# Patient Record
Sex: Male | Born: 1960 | Race: Black or African American | Hispanic: No | Marital: Married | Smoking: Current every day smoker
Health system: Southern US, Community
[De-identification: ages and names within clinical notes are randomized; demographics above are authoritative.]

## PROBLEM LIST (undated history)

## (undated) DIAGNOSIS — I10 Essential (primary) hypertension: Secondary | ICD-10-CM

---

## 2014-01-03 ENCOUNTER — Ambulatory Visit: Payer: Self-pay | Admitting: Family Medicine

## 2014-12-30 ENCOUNTER — Ambulatory Visit
Admission: RE | Admit: 2014-12-30 | Discharge: 2014-12-30 | Disposition: A | Discharge status: Home / Self Care | Payer: BLUE CROSS/BLUE SHIELD | Source: Ambulatory Visit | Attending: Family Medicine | Admitting: Family Medicine

## 2014-12-30 ENCOUNTER — Other Ambulatory Visit: Payer: Self-pay | Admitting: Family Medicine

## 2014-12-30 DIAGNOSIS — M25572 Pain in left ankle and joints of left foot: Secondary | ICD-10-CM

## 2014-12-30 DIAGNOSIS — M79672 Pain in left foot: Secondary | ICD-10-CM

## 2015-01-31 ENCOUNTER — Ambulatory Visit: Payer: Self-pay | Admitting: Family Medicine

## 2017-05-29 ENCOUNTER — Emergency Department (HOSPITAL_COMMUNITY)
Admission: EM | Admit: 2017-05-29 | Discharge: 2017-05-29 | Disposition: A | Discharge status: Home / Self Care | Payer: BLUE CROSS/BLUE SHIELD | Attending: Emergency Medicine | Admitting: Emergency Medicine

## 2017-05-29 ENCOUNTER — Encounter (HOSPITAL_COMMUNITY): Payer: Self-pay | Admitting: *Deleted

## 2017-05-29 ENCOUNTER — Emergency Department (HOSPITAL_COMMUNITY): Payer: BLUE CROSS/BLUE SHIELD

## 2017-05-29 DIAGNOSIS — Z8709 Personal history of other diseases of the respiratory system: Secondary | ICD-10-CM | POA: Diagnosis not present

## 2017-05-29 DIAGNOSIS — J029 Acute pharyngitis, unspecified: Secondary | ICD-10-CM | POA: Insufficient documentation

## 2017-05-29 DIAGNOSIS — F17210 Nicotine dependence, cigarettes, uncomplicated: Secondary | ICD-10-CM | POA: Insufficient documentation

## 2017-05-29 DIAGNOSIS — I10 Essential (primary) hypertension: Secondary | ICD-10-CM | POA: Insufficient documentation

## 2017-05-29 DIAGNOSIS — Z9119 Patient's noncompliance with other medical treatment and regimen: Secondary | ICD-10-CM | POA: Insufficient documentation

## 2017-05-29 HISTORY — DX: Essential (primary) hypertension: I10

## 2017-05-29 LAB — RAPID STREP SCREEN (MED CTR MEBANE ONLY): Streptococcus, Group A Screen (Direct): NEGATIVE

## 2017-05-29 MED ORDER — HYDROCODONE-ACETAMINOPHEN 5-325 MG PO TABS
1.0000 | ORAL_TABLET | Freq: Once | ORAL | Status: AC
  Administered 2017-05-29: 1 via ORAL
  Filled 2017-05-29: qty 1

## 2017-05-29 MED ORDER — HYDROCODONE-ACETAMINOPHEN 5-325 MG PO TABS: 1.0000 | ORAL_TABLET | ORAL | 0 refills | Status: DC | PRN

## 2017-05-29 MED ORDER — CLINDAMYCIN HCL 150 MG PO CAPS
300.0000 mg | ORAL_CAPSULE | Freq: Once | ORAL | Status: AC
  Administered 2017-05-29: 300 mg via ORAL
  Filled 2017-05-29: qty 2

## 2017-05-29 MED ORDER — LOSARTAN POTASSIUM-HCTZ 100-25 MG PO TABS: 1.0000 | ORAL_TABLET | Freq: Every day | ORAL | 0 refills | Status: DC

## 2017-05-29 MED ORDER — CLINDAMYCIN HCL 300 MG PO CAPS: 300.0000 mg | ORAL_CAPSULE | Freq: Four times a day (QID) | ORAL | 0 refills | Status: DC

## 2017-05-29 NOTE — ED Triage Notes (Signed)
Pt has right sided throat swelling starting on Tuesday. Pt right tonsil is touching uvula. Pt not having in obvious breathing difficulties. Pt has had hx of the same with PTA dx and was admitted.

## 2017-05-29 NOTE — ED Provider Notes (Signed)
[facility]-EMERGENCY DEPT Provider Note   CSN: [id number] Arrival date & time: 05/29/17  1743     History   Chief Complaint Chief Complaint  Patient presents with  . Oral Swelling    HPI Nicholas Mcknight is a 56 y.o. male.  HPI Complains of sore throat with pain on swallowing onset 2 days ago, improved today. Pain predominantly on right side of throat. No voice change no difficulty breathing. He reports that earlier today his pain was much worse however it is improved since he drank water. No other treatment prior to coming here. Pain is described as mild at present. He also reports fever earlier today with temperature 101. Seen at urgent care center prior to coming here sent here for further evaluation No shortness of breath no other associated symptoms Past Medical History:  Diagnosis Date  . Hypertension   Peritonsillar abscess 20 years ago, hospitalized in [place]  There are no active problems to display for this patient.   History reviewed. No pertinent surgical history.     Home Medications    Prior to Admission medications   Not on File    Family History No family history on file.  Social History Social History  Substance Use Topics  . Smoking status: Current Every Day Smoker    Packs/day: 0.25    Types: Cigarettes  . Smokeless tobacco: Never Used  . Alcohol use Yes     Comment: occ.   Positive marijuana use no IV drug use  Allergies   Patient has no known allergies.   Review of Systems Review of Systems  Constitutional: Positive for fever.  HENT: Positive for sore throat.   Respiratory: Negative.   Cardiovascular: Negative.   Gastrointestinal: Negative.   Musculoskeletal: Negative.   Skin: Negative.   Neurological: Negative.   Psychiatric/Behavioral: Negative.   All other systems reviewed and are negative.    Physical Exam Updated Vital Signs BP (!) 175/116 (BP Location: Right Arm) Comment: Pt was taking BP medication, but has quit taking it  the past 2 years.   Pulse 95   Temp 98.3 F (36.8 C) (Oral)   Resp 17   Ht  (1.803 m)   Wt 89.8 kg (198 lb)   SpO2 97%   BMI 27.62 kg/m   Physical Exam  Constitutional: He is oriented to person, place, and time. He appears well-developed and well-nourished. No distress.  HENT:  Head: Normocephalic and atraumatic.  Handling secretions well. No hot potato voice. Oropharynx reddened. Bilateral tonsils red and enlarged with right greater than left. Uvula midline  Eyes: Pupils are equal, round, and reactive to light. Conjunctivae are normal.  Neck: Neck supple. No tracheal deviation present. No thyromegaly present.  Cardiovascular: Normal rate and regular rhythm.   No murmur heard. Pulmonary/Chest: Effort normal and breath sounds normal.  Abdominal: Soft. Bowel sounds are normal. He exhibits no distension. There is no tenderness.  Musculoskeletal: Normal range of motion. He exhibits no edema or tenderness.  Lymphadenopathy:    He has no cervical adenopathy.  Neurological: He is alert and oriented to person, place, and time. Coordination normal.  Skin: Skin is warm and dry. No rash noted.  Psychiatric: He has a normal mood and affect.  Nursing note and vitals reviewed.    ED Treatments / Results  Labs (all labs ordered are listed, but only abnormal results are displayed) Labs Reviewed  RAPID STREP SCREEN (NOT AT [facility])  CULTURE, GROUP A STREP [facility])   Results for  orders placed or performed during the hospital encounter of 05/29/17  Rapid strep screen  Result Value Ref Range   Streptococcus, Group A Screen (Direct) NEGATIVE NEGATIVE   Ct Soft Tissue Neck Wo Contrast  Result Date: 05/29/2017 CLINICAL DATA:  Initial evaluation for acute sore throat, right-sided neck swelling for 2 days. EXAM: CT NECK WITHOUT CONTRAST TECHNIQUE: Multidetector CT imaging of the neck was performed following the standard protocol without intravenous contrast. COMPARISON:  None available.  FINDINGS: Pharynx and larynx: Oral cavity within normal limits. Well-circumscribed curvilinear cystic lesion at the right floor of mouth/sublingual space without associated inflammatory changes, favored to reflect a ranula (series 2, image 65). Palatine tonsils are enlarged bilaterally, right greater than left, suggesting acute tonsillitis. Induration of the right parapharyngeal fat. There is swelling with mucosal edema within the adjacent right oropharynx, extending inferiorly within the right pharynx. Trace layering retropharyngeal effusion noted, likely reactive. No frank retropharyngeal collection. No definite tonsillar or peritonsillar collection on this noncontrast examination. Epiglottis itself within normal limits. Lingual tonsils largely efface the vallecula. Remainder of the hypopharynx and supraglottic larynx within normal limits. Motion artifact limits evaluation of the true cords. Subglottic airway clear. Salivary glands: Salivary glands including the parotid and submandibular glands are within normal limits. Thyroid: Thyroid within normal limits. Lymph nodes: No pathologically enlarged lymph nodes identified within the neck. Vascular: Mild scattered plaque about the aortic arch and left carotid bifurcation. Limited intracranial: Intracranial atherosclerosis. Otherwise unremarkable. Visualized orbits: Globes and oval soft tissues within normal limits. Mastoids and visualized paranasal sinuses: Mucous retention cyst present within the inferior right maxillary sinus. Paranasal sinuses otherwise clear. No mastoid effusion. Middle ear cavities are clear. Skeleton: No acute osseous abnormality. No worrisome lytic or blastic osseous lesions. Upper chest: Visualized upper chest within normal limits. Partially visualized lungs are clear. Other: None. IMPRESSION: 1. Asymmetric enlargement of the right palatine tonsil with associated mucosal edema/swelling within the adjacent right pharynx. Findings consistent  with acute tonsillitis/pharyngitis. No appreciable abscess identified on this noncontrast examination. 2. 3.1 x 1.1 x 1.9 cm ranula at the right floor of mouth/sublingual space. Electronically Signed   By: Rise Mu M.D.   On: 05/29/2017 19:36   EKG  EKG Interpretation None       Radiology No results found.  Procedures Procedures (including critical care time)  Medications Ordered in ED Medications - No data to display   Initial Impression / Assessment and Plan / ED Course  I have reviewed the triage vital signs and the nursing notes.  Pertinent labs & imaging results that were available during my care of the patient were reviewed by me and considered in my medical decision making (see chart for details).     Patient hasn't been noncompliant with his blood pressure medicine, Hyzaar for several months. He states he has 20 tablets left in his current prescription and does not currently have a PCP. I've written prescription for Hyzaar 30 day supply and referred him to primary care. I counseled him for 5 minutes on smoking cessation. I've also referred him to Dr. Jenne Pane, ENT specialist if not improving in a week Prescriptions Cleocin, Hyzaar, Norco.  [facility] Controlled Substance reporting System queried Final Clinical Impressions(s) / ED Diagnoses  Diagnoses #1 acute pharyngitis #2 hypertension #3 tobacco abuse #4 medication noncompliance Final diagnoses:  None    New Prescriptions New Prescriptions   No medications on file     Doug Sou, MD 05/29/17 2015

## 2017-05-29 NOTE — Discharge Instructions (Signed)
Take the pain medication as prescribed for bad pain or take Tylenol for mild pain. Don't take the pain medicine prescribed together with Tylenol as accommodation be dangerous to your liver. Call Dr. Jenne Pane to schedule an office visit if your throat is not feeling better by next week. Return if your condition worsens for any reason. Call the number on this chart to get a primary care physician and arrange to get your blood pressure rechecked within a week at your new primary care physician's office or at an urgent care center. Make sure that you take your blood pressure medication. Ask your new primary care physician to help you to stop smoking

## 2017-06-01 LAB — CULTURE, GROUP A STREP (THRC)

## 2019-03-25 ENCOUNTER — Emergency Department (HOSPITAL_COMMUNITY)
Admission: EM | Admit: 2019-03-25 | Discharge: 2019-03-25 | Disposition: A | Discharge status: Home / Self Care | Payer: BC Managed Care – PPO | Attending: Emergency Medicine | Admitting: Emergency Medicine

## 2019-03-25 ENCOUNTER — Emergency Department (HOSPITAL_COMMUNITY): Payer: BC Managed Care – PPO

## 2019-03-25 ENCOUNTER — Encounter (HOSPITAL_COMMUNITY): Payer: Self-pay | Admitting: Emergency Medicine

## 2019-03-25 ENCOUNTER — Other Ambulatory Visit: Payer: Self-pay

## 2019-03-25 DIAGNOSIS — I10 Essential (primary) hypertension: Secondary | ICD-10-CM | POA: Diagnosis present

## 2019-03-25 DIAGNOSIS — Z79899 Other long term (current) drug therapy: Secondary | ICD-10-CM | POA: Insufficient documentation

## 2019-03-25 DIAGNOSIS — F17210 Nicotine dependence, cigarettes, uncomplicated: Secondary | ICD-10-CM | POA: Insufficient documentation

## 2019-03-25 DIAGNOSIS — R519 Headache, unspecified: Secondary | ICD-10-CM

## 2019-03-25 DIAGNOSIS — R51 Headache: Secondary | ICD-10-CM | POA: Insufficient documentation

## 2019-03-25 LAB — CBC
HCT: 52.1 % — ABNORMAL HIGH (ref 39.0–52.0)
Hemoglobin: 16.5 g/dL (ref 13.0–17.0)
MCH: 30 pg (ref 26.0–34.0)
MCHC: 31.7 g/dL (ref 30.0–36.0)
MCV: 94.7 fL (ref 80.0–100.0)
Platelets: 262 10*3/uL (ref 150–400)
RBC: 5.5 MIL/uL (ref 4.22–5.81)
RDW: 13.7 % (ref 11.5–15.5)
WBC: 8.3 10*3/uL (ref 4.0–10.5)
nRBC: 0 % (ref 0.0–0.2)

## 2019-03-25 LAB — BASIC METABOLIC PANEL
Anion gap: 8 (ref 5–15)
BUN: 19 mg/dL (ref 6–20)
CO2: 27 mmol/L (ref 22–32)
Calcium: 9 mg/dL (ref 8.9–10.3)
Chloride: 105 mmol/L (ref 98–111)
Creatinine, Ser: 1.11 mg/dL (ref 0.61–1.24)
GFR calc Af Amer: >60 mL/min (ref >60)
GFR calc non Af Amer: >60 mL/min (ref >60)
Glucose, Bld: 111 mg/dL — ABNORMAL HIGH (ref 70–99)
Potassium: 3.6 mmol/L (ref 3.5–5.1)
Sodium: 140 mmol/L (ref 135–145)

## 2019-03-25 MED ORDER — ACETAMINOPHEN 500 MG PO TABS
1000.0000 mg | ORAL_TABLET | Freq: Once | ORAL | Status: AC
  Administered 2019-03-25: 1000 mg via ORAL
  Filled 2019-03-25: qty 2

## 2019-03-25 MED ORDER — LOSARTAN POTASSIUM-HCTZ 100-25 MG PO TABS: 1.0000 | ORAL_TABLET | Freq: Once | ORAL | Status: DC

## 2019-03-25 MED ORDER — LOSARTAN POTASSIUM 25 MG PO TABS
100.0000 mg | ORAL_TABLET | Freq: Every day | ORAL | Status: DC
  Administered 2019-03-25: 100 mg via ORAL
  Filled 2019-03-25: qty 4

## 2019-03-25 MED ORDER — HYDROCHLOROTHIAZIDE 25 MG PO TABS
25.0000 mg | ORAL_TABLET | Freq: Every day | ORAL | Status: DC
Start: 2019-03-25 — End: 2019-03-25
  Administered 2019-03-25: 10:00:00 25 mg via ORAL
  Filled 2019-03-25: qty 1

## 2019-03-25 NOTE — Discharge Instructions (Addendum)
It was our pleasure to provide your ER care today - we hope that you feel better.  Your blood pressure is high - continue your blood pressure medication, limit salt intake, and follow up with primary care doctor in the coming week for recheck - call your doctor today to discuss whether they want to adjust your blood pressure medication and/or doses.   Take an enteric coated aspirin a day.  Return to ER if worse, new symptoms, fevers, new or severe pain, change in speech or vision, numbness/weakness, chest pain, trouble breathing, or other concern.

## 2019-03-25 NOTE — ED Triage Notes (Signed)
Pt reports home BP 230/133. Pt states he just started his BP medication back last week. Pt endorses headache and vision changes.

## 2019-03-25 NOTE — ED Provider Notes (Signed)
[facility] Provider Note   CSN: [id number] Arrival date & time: 03/25/19  0802     History   Chief Complaint Chief Complaint  Patient presents with  . Hypertension    HPI Lamar BlinksJames C Dellis is a 58 y.o. male.     Patient with hx htn, presents with concern for high blood pressure. States he started back on his bp meds 1 week ago but that bp has stayed high. In past 1-2 days, intermittent frontal headache, dull, non radiating.  States earlier in week briefly saw spots, no current visual changes, no amaurosis or visual field deficit, no eye pain. Denies change in speech. No numbness, weakness, problems w balance or coordination, or loss of normal functional ability. Denies chest pain or discomfort. No unusual doe or sob. No leg swelling.   The history is provided by the patient.  Hypertension Associated symptoms include headaches. Pertinent negatives include no chest pain, no abdominal pain and no shortness of breath.    Past Medical History:  Diagnosis Date  . Hypertension     There are no active problems to display for this patient.   History reviewed. No pertinent surgical history.      Home Medications    Prior to Admission medications   Medication Sig Start Date End Date Taking? Authorizing Provider  clindamycin (CLEOCIN) 300 MG capsule Take 1 capsule (300 mg total) by mouth 4 (four) times daily. X 7 days 05/29/17   Doug SouJacubowitz, Sam, MD  HYDROcodone-acetaminophen (NORCO) 5-325 MG tablet Take 1 tablet by mouth every 4 (four) hours as needed. 05/29/17   Doug SouJacubowitz, Sam, MD  losartan-hydrochlorothiazide (HYZAAR) 100-25 MG tablet Take 1 tablet by mouth daily. 05/29/17   Doug SouJacubowitz, Sam, MD    Family History No family history on file.  Social History Social History   Tobacco Use  . Smoking status: Current Every Day Smoker    Packs/day: 0.25    Types: Cigarettes  . Smokeless tobacco: Never Used  Substance Use Topics  . Alcohol use: Yes   Comment: occ.  . Drug use: No     Allergies   Patient has no known allergies.   Review of Systems Review of Systems  Constitutional: Negative for fever.  HENT: Negative for sore throat.   Eyes: Negative for pain.  Respiratory: Negative for cough and shortness of breath.   Cardiovascular: Negative for chest pain.  Gastrointestinal: Negative for abdominal pain, nausea and vomiting.  Genitourinary: Negative for flank pain.  Musculoskeletal: Negative for back pain and neck pain.  Skin: Negative for rash.  Neurological: Positive for headaches. Negative for speech difficulty, weakness and numbness.  Hematological: Does not bruise/bleed easily.  Psychiatric/Behavioral: Negative for confusion.     Physical Exam Updated Vital Signs BP (!) 178/115 (BP Location: Right Arm)   Pulse 96   Temp 98.4 F (36.9 C) (Oral)   Resp 16   Ht 1.778 m (5\' 10" )   Wt 97.1 kg   SpO2 98%   BMI 30.71 kg/m   Physical Exam Vitals signs and nursing note reviewed.  Constitutional:      Appearance: Normal appearance. He is well-developed.  HENT:     Head: Atraumatic.     Comments: No sinus or temporal tenderness.     Nose: Nose normal.     Mouth/Throat:     Mouth: Mucous membranes are moist.     Pharynx: Oropharynx is clear.  Eyes:     General: No scleral icterus.    Extraocular  Movements: Extraocular movements intact.     Conjunctiva/sclera: Conjunctivae normal.     Pupils: Pupils are equal, round, and reactive to light.  Neck:     Musculoskeletal: Normal range of motion and neck supple. No neck rigidity.     Vascular: No carotid bruit.     Trachea: No tracheal deviation.  Cardiovascular:     Rate and Rhythm: Normal rate and regular rhythm.     Pulses: Normal pulses.     Heart sounds: Normal heart sounds. No murmur. No friction rub. No gallop.   Pulmonary:     Effort: Pulmonary effort is normal. No accessory muscle usage or respiratory distress.     Breath sounds: Normal breath sounds.   Abdominal:     General: Bowel sounds are normal. There is no distension.     Palpations: Abdomen is soft.     Tenderness: There is no abdominal tenderness. There is no guarding.  Genitourinary:    Comments: No cva tenderness. Musculoskeletal:        General: No swelling or tenderness.     Right lower leg: No edema.     Left lower leg: No edema.  Skin:    General: Skin is warm and dry.     Findings: No rash.  Neurological:     Mental Status: He is alert.     Comments: Alert, speech clear/fluent. No facial weakness or droop. Motor intact bil, stre 5/5. sens grossly intact bil. Steady gait.   Psychiatric:        Mood and Affect: Mood normal.      ED Treatments / Results  Labs (all labs ordered are listed, but only abnormal results are displayed) Results for orders placed or performed during the hospital encounter of 03/25/19  Basic metabolic panel  Result Value Ref Range   Sodium 140 135 - 145 mmol/L   Potassium 3.6 3.5 - 5.1 mmol/L   Chloride 105 98 - 111 mmol/L   CO2 27 22 - 32 mmol/L   Glucose, Bld 111 (H) 70 - 99 mg/dL   BUN 19 6 - 20 mg/dL   Creatinine, Ser 1.611.11 0.61 - 1.24 mg/dL   Calcium 9.0 8.9 - 09.610.3 mg/dL   GFR calc non Af Amer >60 >60 mL/min   GFR calc Af Amer >60 >60 mL/min   Anion gap 8 5 - 15  CBC  Result Value Ref Range   WBC 8.3 4.0 - 10.5 K/uL   RBC 5.50 4.22 - 5.81 MIL/uL   Hemoglobin 16.5 13.0 - 17.0 g/dL   HCT 04.552.1 (H) 40.939.0 - 81.152.0 %   MCV 94.7 80.0 - 100.0 fL   MCH 30.0 26.0 - 34.0 pg   MCHC 31.7 30.0 - 36.0 g/dL   RDW 91.413.7 78.211.5 - 95.615.5 %   Platelets 262 150 - 400 K/uL   nRBC 0.0 0.0 - 0.2 %   Ct Head Wo Contrast  Result Date: 03/25/2019 CLINICAL DATA:  Acute headache with dizziness. EXAM: CT HEAD WITHOUT CONTRAST TECHNIQUE: Contiguous axial images were obtained from the base of the skull through the vertex without intravenous contrast. COMPARISON:  None. FINDINGS: Brain: Ventricles are within normal limits in size and configuration. 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 Minimal chronic small vessel ischemic changes within the bilateral deep periventricular white matter regions. No mass, hemorrhage, edema or other evidence of acute parenchymal abnormality.  No extra-axial hemorrhage. Vascular: Chronic calcified atherosclerotic changes of the large vessels at the skull base. No unexpected hyperdense vessel. Skull: Normal. Negative for fracture or focal lesion. Sinuses/Orbits: No acute finding. Other: None. IMPRESSION: 1. 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 2. Minimal chronic small vessel ischemic changes within the white matter. 3. No evidence of acute intracranial abnormality. No intracranial mass, hemorrhage or edema. Electronically Signed   By: Franki Cabot M.D.   On: 03/25/2019 10:14    EKG EKG Interpretation  Date/Time:  Thursday March 25 2019 08:17:01 EDT Ventricular Rate:  94 PR Interval:    QRS Duration: 116 QT Interval:  377 QTC Calculation: 472 R Axis:   38 Text Interpretation:  Sinus rhythm Nonspecific T wave abnormality No previous tracing Confirmed by Lajean Saver [phone]) on 03/25/2019 8:26:48 AM   Radiology Ct Head Wo Contrast  Result Date: 03/25/2019 CLINICAL DATA:  Acute headache with dizziness. EXAM: CT HEAD WITHOUT CONTRAST TECHNIQUE: Contiguous axial images were obtained from the base of the skull through the vertex without intravenous contrast. COMPARISON:  None. FINDINGS: Brain: Ventricles are within normal limits in size and configuration. 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 Minimal chronic small  vessel ischemic changes within the bilateral deep periventricular white matter regions. No mass, hemorrhage, edema or other evidence of acute parenchymal abnormality. No extra-axial hemorrhage. Vascular: Chronic calcified atherosclerotic changes of the large vessels at the skull base. No unexpected hyperdense vessel. Skull: Normal. Negative for fracture or focal lesion. Sinuses/Orbits: No acute finding. Other: None. IMPRESSION: 1. 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 2. Minimal chronic small vessel ischemic changes within the white matter. 3. No evidence of acute intracranial abnormality. No intracranial mass, hemorrhage or edema. Electronically Signed   By: Franki Cabot M.D.   On: 03/25/2019 10:14    Procedures Procedures (including critical care time)  Medications Ordered in ED Medications  acetaminophen (TYLENOL) tablet 1,000 mg (has no administration in time range)  losartan (COZAAR) tablet 100 mg (has no administration in time range)  hydrochlorothiazide (HYDRODIURIL) tablet 25 mg (has no administration in time range)     Initial Impression / Assessment and Plan / ED Course  I have reviewed the triage vital signs and the nursing notes.  Pertinent labs & imaging results that were available during my care of the patient were reviewed by me and considered in my medical decision making (see chart for details).  Labs and imaging ordered.   Reviewed nursing notes and prior charts for additional history.   Pt has not yet had his bp meds today - dose given in ED.   Acetaminophen po.  Labs reviewed by me - chem normal, cr normal.  CT reviewed by me - no acute hem. Note made of chronic changes. Discussed w pt. rec ecasa.  bp is improved from prior. Patient comfortable, no severe head pain. No nv.   Rec close pcp f/u.  Return precautions provided.    Final Clinical Impressions(s) / ED Diagnoses   Final diagnoses:  None    ED Discharge Orders    None       Lajean Saver, MD 03/25/19  1019  

## 2019-04-19 ENCOUNTER — Encounter: Payer: Self-pay | Admitting: Cardiovascular Disease

## 2019-04-19 ENCOUNTER — Other Ambulatory Visit: Payer: Self-pay

## 2019-04-19 ENCOUNTER — Ambulatory Visit (INDEPENDENT_AMBULATORY_CARE_PROVIDER_SITE_OTHER): Payer: BC Managed Care – PPO | Admitting: Cardiovascular Disease

## 2019-04-19 VITALS — BP 162/96 | HR 75 | Temp 97.8°F | Ht 71.0 in | Wt 226.2 lb

## 2019-04-19 DIAGNOSIS — Z8673 Personal history of transient ischemic attack (TIA), and cerebral infarction without residual deficits: Secondary | ICD-10-CM

## 2019-04-19 DIAGNOSIS — Z9289 Personal history of other medical treatment: Secondary | ICD-10-CM

## 2019-04-19 DIAGNOSIS — I10 Essential (primary) hypertension: Secondary | ICD-10-CM

## 2019-04-19 DIAGNOSIS — G4730 Sleep apnea, unspecified: Secondary | ICD-10-CM | POA: Diagnosis not present

## 2019-04-19 DIAGNOSIS — R0609 Other forms of dyspnea: Secondary | ICD-10-CM | POA: Diagnosis not present

## 2019-04-19 DIAGNOSIS — R0602 Shortness of breath: Secondary | ICD-10-CM | POA: Diagnosis not present

## 2019-04-19 DIAGNOSIS — R9431 Abnormal electrocardiogram [ECG] [EKG]: Secondary | ICD-10-CM

## 2019-04-19 MED ORDER — ATORVASTATIN CALCIUM 40 MG PO TABS: 40.0000 mg | ORAL_TABLET | Freq: Every day | ORAL | 3 refills | Status: DC

## 2019-04-19 MED ORDER — LABETALOL HCL 100 MG PO TABS: 100.0000 mg | ORAL_TABLET | Freq: Two times a day (BID) | ORAL | 3 refills | Status: DC

## 2019-04-19 MED ORDER — AMLODIPINE BESYLATE 5 MG PO TABS: 5.0000 mg | ORAL_TABLET | Freq: Every day | ORAL | 3 refills | Status: DC

## 2019-04-19 NOTE — Patient Instructions (Signed)
Medication Instructions:  START ASPIRIN 81 MG DAILY   START AMLODIPINE 5 MG DAILY  START LABETALOL 100 MG- TWO TIMES DAILY START LIPITOR 40 MG DAILY   STOP LOPRESSOR  STOP CLONIDINE  - PLEASE STOP THESE TWO MEDICATIONS, ONLY AFTER YOU HAVE PICKED THE OTHER MEDICATION UP AND ARE PREPARED TO START IT THE NEXT DAY    Labwork: NONE  Testing/Procedures: Your physician has requested that you have an echocardiogram. Echocardiography is a painless test that uses sound waves to create images of your heart. It provides your doctor with information about the size and shape of your heart and how well your heart's chambers and valves are working. This procedure takes approximately one hour. There are no restrictions for this procedure.  Your physician has requested that you have a lexiscan myoview. For further information please visit [email or web address]. Please follow instruction sheet, as given.    Follow-Up: Your physician recommends that you schedule a follow-up appointment in: 6 WEEKS    Any Other Special Instructions Will Be Listed Below (If Applicable).  You have been referred to [facility]    If you need a refill on your cardiac medications before your next appointment, please call your pharmacy.

## 2019-04-19 NOTE — Progress Notes (Signed)
CARDIOLOGY CONSULT NOTE  Patient ID: Nicholas Mcknight MRN: [id number] DOB/AGE: May 26, 1961 58 y.o.  Admit date: (Not on file) [facility] Referring Physician: Alisia FerrariGill, Pushpinder K, NP.  Reason for Consultation: Dyspnea on exertion, hypertension  HPI: Nicholas BlinksJames C Urschel is a 58 y.o. male who is being seen today for the evaluation of dyspnea on exertion and uncontrolled hypertension at the request of Alisia FerrariGill, Pushpinder K, NP.   He was evaluated in the ED on 03/25/2019.  I reviewed all relevant documentation, labs, and studies.  In the ED he complained of hypertension and a headache.  He denied chest pain or shortness of breath at that time.  Blood pressure in the ED was 178/115 with a heart rate of 96.  Basic metabolic panel and CBC were unremarkable.  He had not taken his blood pressure medications on the day of ED evaluation.    Head CT showed evidence of subacute versus chronic lacunar infarct.  There were minimal chronic small vessel ischemic changes within the white matter with no acute intracranial abnormalities.  I personally reviewed the ECG which demonstrated sinus rhythm with T wave inversions in V5 and V6.  For the past several months he has felt sluggish.  He denies exertional chest pain and is fine walking on level ground.  When he climbs a flight of stairs he quickly become short of breath.  He also describes poor quality sleep.  He admits to snoring quite a bit and is aware if he has sleep apnea.  He does wake up feeling fatigued and has morning headaches.  His blood pressure was recently checked at work when he felt lightheaded and it was 170/95.  He works as a Patent attorneywelder primarily on transformers.  Family history: Mother had bypass surgery in her 2070s and brother had bypass surgery in mid 9850s.    No Known Allergies  Current Outpatient Medications  Medication Sig Dispense Refill  . cloNIDine HCl (KAPVAY) 0.1 MG TB12 ER  tablet Take 0.2 mg by mouth 2 (two) times daily.     Marland Kitchen. losartan-hydrochlorothiazide (HYZAAR) 100-25 MG tablet Take 1 tablet by mouth daily. 30 tablet 0  . metoprolol tartrate (LOPRESSOR) 25 MG tablet      No current facility-administered medications for this visit.     Past Medical History:  Diagnosis Date  . Hypertension     History reviewed. No pertinent surgical history.  Social History   Socioeconomic History  . Marital status: Married    Spouse name: Not on file  . Number of children: Not on file  . Years of education: Not on file  . Highest education level: Not on file  Occupational History  . Not on file  Social Needs  . Financial resource strain: Not on file  . Food insecurity    Worry: Not on file    Inability: Not on file  . Transportation needs    Medical: Not on file    Non-medical: Not on file  Tobacco Use  . Smoking status: Current Every Day Smoker    Packs/day: 0.25    Types: Cigarettes  . Smokeless tobacco: Never Used  Substance and Sexual Activity  . Alcohol use: Yes    Comment: occ.  . Drug use: No  . Sexual activity: Not on file  Lifestyle  . Physical activity    Days per week: Not on file    Minutes per session: Not on file  .  Stress: Not on file  Relationships  . Social Herbalist on phone: Not on file    Gets together: Not on file    Attends religious service: Not on file    Active member of club or organization: Not on file    Attends meetings of clubs or organizations: Not on file    Relationship status: Not on file  . Intimate partner violence    Fear of current or ex partner: Not on file    Emotionally abused: Not on file    Physically abused: Not on file    Forced sexual activity: Not on file  Other Topics Concern  . Not on file  Social History Narrative  . Not on file      Current Meds  Medication Sig  . cloNIDine HCl (KAPVAY) 0.1 MG TB12 ER tablet Take 0.2 mg by mouth 2 (two) times daily.   Marland Kitchen  losartan-hydrochlorothiazide (HYZAAR) 100-25 MG tablet Take 1 tablet by mouth daily.  . metoprolol tartrate (LOPRESSOR) 25 MG tablet       Review of systems complete and found to be negative unless listed above in HPI    Physical exam Blood pressure (!) 162/96, pulse 75, temperature 97.8 F (36.6 C), height 5\' 11"  (1.803 m), weight 226 lb 3.2 oz (102.6 kg), SpO2 97 %. General: NAD Neck: No JVD, no thyromegaly or thyroid nodule.  Lungs: Clear to auscultation bilaterally with normal respiratory effort. CV: Nondisplaced PMI. Regular rate and rhythm, normal S1/S2, no S3/S4, no murmur.  No peripheral edema.  No carotid bruit.    Abdomen: Soft, nontender, no distention.  Skin: Intact without lesions or rashes.  Neurologic: Alert and oriented x 3.  Psych: Normal affect. Extremities: No clubbing or cyanosis.  HEENT: Normal.   ECG: Most recent ECG reviewed.   Labs: Lab Results  Component Value Date/Time   K 3.6 03/25/2019 09:39 AM   BUN 19 03/25/2019 09:39 AM   CREATININE 1.11 03/25/2019 09:39 AM   HGB 16.5 03/25/2019 09:39 AM     Lipids: No results found for: LDLCALC, LDLDIRECT, CHOL, TRIG, HDL      ASSESSMENT AND PLAN:  1.  Dyspnea on exertion: His cardiovascular risk factors including hypertension and history of stroke.  He also has a strong family history of premature coronary artery disease.  He may have hypertensive heart disease which would contribute to his symptoms. I will order a 2-D echocardiogram with Doppler to evaluate cardiac structure, function, and regional wall motion.  ECG does demonstrate T wave inversions in V5 and V6 suggestive of lateral wall ischemia. I will proceed with a nuclear myocardial perfusion imaging study to evaluate for ischemic heart disease (Lexiscan Myoview).  Given his history of stroke I will start atorvastatin 40 mg.  He is already taking aspirin 81 mg.  2.  Accelerated hypertension: Blood pressure is elevated.  He is on clonidine 0.2 mg  twice daily and Hyzaar 100-25 mg daily.  He also takes Lopressor 25 mg daily which is a recent medication. I will start amlodipine 5 mg daily and labetalol 100 mg twice daily and stop clonidine and metoprolol.  I told him not to suddenly stop clonidine without starting the new medications which I am prescribing today. He also appears to have sleep disordered breathing and I am going to make referral for a sleep study as sleep apnea would be contributing to accelerated hypertension.  3.  Sleep disordered breathing: He has a history of snoring, morning  headaches, and daytime somnolence.  I will make referral for a sleep study.  I explained to him that sleep apnea is an independent risk factor for CVA and MI.  He already has a history of CVA.  He also has accelerated hypertension.  4.  History of CVA: He is on aspirin.  I will start atorvastatin 40 mg.    Disposition: Follow up in 3 months.  I will provide him with a letter to be out of work this week.   Signed: Prentice DockerSuresh Breylen Agyeman, M.D., F.A.C.C.  04/19/2019, 2:06 PM

## 2019-04-22 ENCOUNTER — Other Ambulatory Visit: Payer: Self-pay

## 2019-04-22 ENCOUNTER — Encounter (HOSPITAL_COMMUNITY)
Admission: RE | Admit: 2019-04-22 | Discharge: 2019-04-22 | Disposition: A | Discharge status: Home / Self Care | Payer: BC Managed Care – PPO | Source: Ambulatory Visit | Attending: Cardiovascular Disease | Admitting: Cardiovascular Disease

## 2019-04-22 ENCOUNTER — Encounter (HOSPITAL_COMMUNITY): Payer: Self-pay

## 2019-04-22 ENCOUNTER — Ambulatory Visit (HOSPITAL_COMMUNITY)
Admission: RE | Admit: 2019-04-22 | Discharge: 2019-04-22 | Disposition: A | Discharge status: Home / Self Care | Payer: BC Managed Care – PPO | Source: Ambulatory Visit | Attending: Cardiovascular Disease | Admitting: Cardiovascular Disease

## 2019-04-22 ENCOUNTER — Encounter (HOSPITAL_BASED_OUTPATIENT_CLINIC_OR_DEPARTMENT_OTHER)
Admission: RE | Admit: 2019-04-22 | Discharge: 2019-04-22 | Disposition: A | Discharge status: Home / Self Care | Payer: BC Managed Care – PPO | Source: Ambulatory Visit | Attending: Cardiovascular Disease | Admitting: Cardiovascular Disease

## 2019-04-22 DIAGNOSIS — R0609 Other forms of dyspnea: Secondary | ICD-10-CM

## 2019-04-22 DIAGNOSIS — R0602 Shortness of breath: Secondary | ICD-10-CM

## 2019-04-22 DIAGNOSIS — G4730 Sleep apnea, unspecified: Secondary | ICD-10-CM | POA: Insufficient documentation

## 2019-04-22 LAB — NM MYOCAR MULTI W/SPECT W/WALL MOTION / EF
LV dias vol: 104 mL (ref 62–150)
LV sys vol: 52 mL
Peak HR: 81 {beats}/min
RATE: 0.3
Rest HR: 74 {beats}/min
SDS: 1
SRS: 3
SSS: 4
TID: 0.95

## 2019-04-22 MED ORDER — SODIUM CHLORIDE 0.9% FLUSH
INTRAVENOUS | Status: AC
  Administered 2019-04-22: 10:00:00 10 mL via INTRAVENOUS
  Filled 2019-04-22: qty 10

## 2019-04-22 MED ORDER — REGADENOSON 0.4 MG/5ML IV SOLN
INTRAVENOUS | Status: AC
  Administered 2019-04-22: 10:00:00 0.4 mg via INTRAVENOUS
  Filled 2019-04-22: qty 5

## 2019-04-22 MED ORDER — TECHNETIUM TC 99M TETROFOSMIN IV KIT
30.0000 | PACK | Freq: Once | INTRAVENOUS | Status: AC | PRN
  Administered 2019-04-22: 11:00:00 30 via INTRAVENOUS

## 2019-04-22 MED ORDER — TECHNETIUM TC 99M TETROFOSMIN IV KIT
10.0000 | PACK | Freq: Once | INTRAVENOUS | Status: AC | PRN
  Administered 2019-04-22: 09:00:00 10.08 via INTRAVENOUS

## 2019-04-22 NOTE — Progress Notes (Signed)
*  PRELIMINARY RESULTS* Echocardiogram 2D Echocardiogram has been performed.  Nicholas Mcknight 04/22/2019, 9:26 AM

## 2019-04-26 ENCOUNTER — Telehealth: Payer: Self-pay | Admitting: *Deleted

## 2019-04-26 ENCOUNTER — Telehealth: Payer: Self-pay | Admitting: Cardiovascular Disease

## 2019-04-26 DIAGNOSIS — I714 Abdominal aortic aneurysm, without rupture, unspecified: Secondary | ICD-10-CM

## 2019-04-26 NOTE — Telephone Encounter (Signed)
-----   Message from Herminio Commons, MD sent at 04/23/2019 11:23 AM EDT ----- Pumping function is normal.  There is a mild ascending aortic aneurysm.  Please obtain a CTA chest for further assessment.

## 2019-04-26 NOTE — Telephone Encounter (Signed)
Please give pt a call for Echo results and would like to know when he is able to return to work.

## 2019-04-26 NOTE — Telephone Encounter (Signed)
After CCTA results.

## 2019-04-26 NOTE — Telephone Encounter (Signed)
Patient given echo results, awaits CTA, order placed by K Pinnix LPN

## 2019-04-26 NOTE — Telephone Encounter (Signed)
LMTCB with echo results, when may patient go back to work?

## 2019-04-28 ENCOUNTER — Ambulatory Visit (HOSPITAL_COMMUNITY): Admission: RE | Admit: 2019-04-28 | Payer: BC Managed Care – PPO | Source: Ambulatory Visit

## 2019-05-05 ENCOUNTER — Ambulatory Visit (HOSPITAL_COMMUNITY)
Admission: RE | Admit: 2019-05-05 | Discharge: 2019-05-05 | Disposition: A | Discharge status: Home / Self Care | Payer: BC Managed Care – PPO | Source: Ambulatory Visit | Attending: Cardiovascular Disease | Admitting: Cardiovascular Disease

## 2019-05-05 ENCOUNTER — Other Ambulatory Visit: Payer: Self-pay

## 2019-05-05 DIAGNOSIS — I714 Abdominal aortic aneurysm, without rupture: Secondary | ICD-10-CM | POA: Diagnosis not present

## 2019-05-05 MED ORDER — IOHEXOL 350 MG/ML SOLN
100.0000 mL | Freq: Once | INTRAVENOUS | Status: AC | PRN
  Administered 2019-05-05: 16:00:00 100 mL via INTRAVENOUS

## 2019-05-06 ENCOUNTER — Telehealth: Payer: Self-pay

## 2019-05-06 DIAGNOSIS — I712 Thoracic aortic aneurysm, without rupture, unspecified: Secondary | ICD-10-CM

## 2019-05-06 NOTE — Telephone Encounter (Signed)
Patient aware of CT results and that referral was placed to [facility]

## 2019-05-06 NOTE — Telephone Encounter (Signed)
-----   Message from Herminio Commons, MD sent at 05/06/2019 11:12 AM EDT ----- Ascending aortic aneurysm measures 4.7 cm. Will need routine surveillance imaging. Please make referral to CT surgery.

## 2019-05-11 ENCOUNTER — Telehealth: Payer: Self-pay | Admitting: Cardiovascular Disease

## 2019-05-11 NOTE — Telephone Encounter (Signed)
Has appt with CT 9/25 - ok to give him note to remain out of work until then?

## 2019-05-11 NOTE — Telephone Encounter (Signed)
Please give the patient a call-- he is needing a note for work because he has not seen the provider that Dr. Bronson Ing referred him to and he's still out of work.

## 2019-05-12 NOTE — Telephone Encounter (Signed)
He can return to work. Needs to monitor BP to make certain it is under good control.

## 2019-05-12 NOTE — Telephone Encounter (Signed)
LM to return call.

## 2019-05-12 NOTE — Telephone Encounter (Signed)
Called pt. No answer, left message for pt to return call.  

## 2019-05-21 ENCOUNTER — Institutional Professional Consult (permissible substitution) (INDEPENDENT_AMBULATORY_CARE_PROVIDER_SITE_OTHER): Payer: BC Managed Care – PPO | Admitting: Cardiothoracic Surgery

## 2019-05-21 ENCOUNTER — Other Ambulatory Visit: Payer: Self-pay

## 2019-05-21 ENCOUNTER — Encounter: Payer: Self-pay | Admitting: Cardiothoracic Surgery

## 2019-05-21 VITALS — BP 150/106 | HR 89 | Temp 97.5°F | Resp 18 | Ht 71.0 in | Wt 226.0 lb

## 2019-05-21 DIAGNOSIS — I712 Thoracic aortic aneurysm, without rupture, unspecified: Secondary | ICD-10-CM

## 2019-05-26 NOTE — Progress Notes (Signed)
[address]       [place] [address]             [phone]     CARDIOTHORACIC SURGERY CONSULTATION REPORT  Referring Provider is Laqueta Linden, MD Primary Cardiologist is No primary care provider on file. PCP is [facility]  Chief Complaint  Patient presents with  . TAA    NEW REFERRAL ...ECHO 04/21/28  CTA CHEST 05/05/19    HPI:  58 year old gentleman history of tobacco abuse and hypertension recently was evaluated in the emergency department setting for chest pain.  Underwent CT scan of the chest which demonstrated ascending aortic aneurysm, measuring 4.7 cm in diameter at the level of the mid main pulmonary artery.  Pain was felt to be related to his extreme hypertension at that point.  He was discharged with medical management and instructed to follow-up with thoracic surgery to discuss the diagnosis of aneurysm.  The patient denies any family history of aneurysm disease but his brother who is younger has undergone heart surgery.  He reports that he is cut down on cigarette smoking but still smokes daily.  He also reports that his blood pressure has been improved lately but the diastolic remains often at 90 mmHg or more.  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  Past Medical History:  Diagnosis Date  . Hypertension     No past surgical history on file.  Family History  Problem Relation Age of Onset  . Hypertension Mother   . Cirrhosis Father   . Hypertension Brother     Social History   Socioeconomic History  . Marital status: Married    Spouse name: Not on file  . Number of children: Not on file  . Years of education: Not on file  . Highest education level: Not on file  Occupational History  . Not on file  Social Needs  . Financial resource strain: Not on file  . Food insecurity    Worry: Not on file    Inability: Not on file  . Transportation  needs    Medical: Not on file    Non-medical: Not on file  Tobacco Use  . Smoking status: Current Every Day Smoker    Packs/day: 0.25    Types: Cigarettes  . Smokeless tobacco: Never Used  Substance and Sexual Activity  . Alcohol use: Yes    Comment: occ.  . Drug use: No  . Sexual activity: Not on file  Lifestyle  . Physical activity    Days per week: Not on file    Minutes per session: Not on file  . Stress: Not on file  Relationships  . Social Musician on phone: Not on file    Gets together: Not on file    Attends religious service: Not on file    Active member of club or organization: Not on file    Attends meetings of clubs or organizations: Not on file    Relationship status: Not on file  . Intimate partner violence    Fear of current or ex partner: Not on file    Emotionally abused: Not on file    Physically abused: Not on file    Forced sexual activity: Not on file  Other Topics Concern  . Not on file  Social History Narrative  . Not on  file    Current Outpatient Medications  Medication Sig Dispense Refill  . amLODipine (NORVASC) 5 MG tablet Take 1 tablet (5 mg total) by mouth daily. 180 tablet 3  . atorvastatin (LIPITOR) 40 MG tablet Take 1 tablet (40 mg total) by mouth daily. 90 tablet 3  . labetalol (NORMODYNE) 100 MG tablet Take 1 tablet (100 mg total) by mouth 2 (two) times daily. 180 tablet 3  . losartan-hydrochlorothiazide (HYZAAR) 100-25 MG tablet Take 1 tablet by mouth daily. 30 tablet 0   No current facility-administered medications for this visit.     No Known Allergies    Review of Systems:   General:  good appetite, good energy, no weight gain or loss Cardiac:  Per HPI  Respiratory:  Denies shortness of breath,  non productive cough,   GI:   Neg  difficulty swallowing,  , no abdominal pain   GU:   denies kidney disease  Vascular:  No pain suggestive of claudication,  no DVT  Neuro:   Denies stroke/ TIA's,   Musculoskeletal: no  arthritis/ joint swelling,    Skin:   neg  Psych:   neg  Eyes:   neg  ENT:   neg  Hematologic:  neg  Endocrine:  no diabetes, does not check CBG's at home     Physical Exam:   BP (!) 150/106 (BP Location: Left Arm, Patient Position: Sitting, Cuff Size: Normal)   Pulse 89   Temp (!) 97.5 F (36.4 C)   Resp 18   Ht 5\' 11"  (1.803 m)   Wt 102.5 kg   SpO2 98% Comment: RA  BMI 31.52 kg/m   General:  well-appearing,NAD  HEENT:  Unremarkable   Neck:   no JVD, no bruits, no adenopathy   Chest:   clear to auscultation, symmetrical breath sounds, no wheezes, no rhonchi  CV:   RRR, no murmur   Abdomen:  soft, non-tender, no masses   Extremities:  warm, well-perfused, pulses intact throughout, no LE edema  Rectal/GU  Deferred  Neuro:   Grossly non-focal and symmetrical throughout  Skin:   Clean and dry, no rashes, no breakdown   Diagnostic Tests:  I have reviewed available imaging and reports and agree with their interpretation.    Impression:  58 yo male with severe HTN and asc aortic aneurysm. His biggest risks for aortic catastrophe are his continued smoking and HTN which is difficult to control. He is counseled concerning the importance of managing both these risks effectively.     Plan:  He is provided a note to return to work. We will repeat CT in 3 months and see him in follow-up. He is advised to re-present to the ED for any unremitting chest or back pain.     I spent in excess of 45 minutes during the conduct of this office consultation and >50% of this time involved direct face-to-face encounter with the patient for counseling and/or coordination of their care.          Level 3 Office Consult = 40 minutes         Level 4 Office Consult = 60 minutes         Level 5 Office Consult = 80 minutes  B. Murvin Natal, MD 05/26/2019 11:10 AM

## 2019-06-10 ENCOUNTER — Other Ambulatory Visit: Payer: Self-pay

## 2019-06-10 ENCOUNTER — Telehealth: Payer: BC Managed Care – PPO | Admitting: Cardiovascular Disease

## 2019-07-13 ENCOUNTER — Other Ambulatory Visit: Payer: Self-pay | Admitting: *Deleted

## 2019-07-13 DIAGNOSIS — I712 Thoracic aortic aneurysm, without rupture, unspecified: Secondary | ICD-10-CM

## 2019-07-27 ENCOUNTER — Other Ambulatory Visit: Payer: Self-pay | Admitting: Cardiothoracic Surgery

## 2019-08-11 ENCOUNTER — Ambulatory Visit: Payer: BC Managed Care – PPO | Admitting: Surgery

## 2019-08-13 ENCOUNTER — Other Ambulatory Visit: Payer: BC Managed Care – PPO

## 2019-08-13 ENCOUNTER — Ambulatory Visit: Payer: BC Managed Care – PPO | Admitting: Cardiothoracic Surgery

## 2019-08-16 ENCOUNTER — Ambulatory Visit: Payer: BC Managed Care – PPO | Admitting: Cardiothoracic Surgery

## 2019-08-16 ENCOUNTER — Ambulatory Visit
Admission: RE | Admit: 2019-08-16 | Discharge: 2019-08-16 | Disposition: A | Discharge status: Home / Self Care | Payer: BC Managed Care – PPO | Source: Ambulatory Visit | Attending: Cardiothoracic Surgery | Admitting: Cardiothoracic Surgery

## 2019-08-16 DIAGNOSIS — I712 Thoracic aortic aneurysm, without rupture, unspecified: Secondary | ICD-10-CM

## 2019-08-17 ENCOUNTER — Encounter: Payer: Self-pay | Admitting: Cardiothoracic Surgery

## 2019-10-04 ENCOUNTER — Ambulatory Visit: Payer: Self-pay | Admitting: Cardiothoracic Surgery

## 2020-06-11 NOTE — Progress Notes (Addendum)
Cardiology Office Note  Date: 06/12/2020   ID: Nicholas Mcknight, DOB Dec 26, 1960, MRN [id number]  PCP:  [facility]  Cardiologist:  No primary care provider on file. Electrophysiologist:  None   Chief Complaint: Follow-up dyspnea on exertion.  History of Present Illness: Nicholas Mcknight is a 59 y.o. male with a history of DOE, accelerated hypertension, sleep disordered breathing, CVA, HTN.  Thoracic ascending aortic aneurysm 4.6 cm last study on 08/16/2019.  Last seen by Dr. Purvis Sheffield 04/19/2019 for dyspnea on exertion.  He had been evaluated in the emergency department on 03/25/2019 with complaints of hypertension and headache.  Blood pressure was elevated in the emergency department at 178/115.  CBC and basic metabolic panel was unremarkable.  He had not taken his blood pressure medications on the day of ED visit.  Head CT showed evidence of subacute versus chronic lacunar infarct.  Minimal chronic small vessel ischemic changes within the white matter no acute intracranial abnormalities.  Had been feeling sluggish for several months.  Stated when he climbed a flight of stairs he quickly became short of breath.  He described poor sleep with snoring.  Waking up up feeling fatigue with morning headaches.  Echocardiogram was ordered.  Lexiscan Myoview stress test was ordered.  He was started on amlodipine daily and labetalol 100 mg p.o. twice daily.  Clonidine was stopped as well as Metoprolol.  He was continuing Hyzaar 100-25 mg daily.  He was referred for sleep study.  He was continuing aspirin for history of CVA and atorvastatin 40 mg was started.  He is here for 77-month follow-up today.  Describes some mild dyspnea on exertion, with occasional chest discomfort which is short-lived.  He denies any associated radiation to neck, arm, back or jaw when chest pain occurs.  Continues to smoke 5 to 7 cigarettes/day with a long-term history of smoking.  We spoke about possible  pulmonary referral.  His blood pressure is elevated on arrival at 148/90.  Denies any lightheadedness, dizziness, presyncopal or syncopal episodes.  Denies any CVA or TIA-like symptoms, PND or orthopnea, bleeding issues, palpitations or arrhythmias.  Has a history of thoracic aortic aneurysm with last CT done December 2020.  At that time aneurysm was 4.6 cm.  Denies any claudication-like symptoms, DVT or PE-like symptoms, or lower extremity edema.    Past Medical History:  Diagnosis Date  . Hypertension     No past surgical history on file.  Current Outpatient Medications  Medication Sig Dispense Refill  . amLODipine (NORVASC) 5 MG tablet Take 1 tablet (5 mg total) by mouth daily. 180 tablet 3  . atorvastatin (LIPITOR) 40 MG tablet Take 1 tablet (40 mg total) by mouth daily. 90 tablet 3  . labetalol (NORMODYNE) 100 MG tablet Take 1 tablet (100 mg total) by mouth 2 (two) times daily. 180 tablet 3  . losartan-hydrochlorothiazide (HYZAAR) 100-25 MG tablet Take 1 tablet by mouth daily. 30 tablet 0   No current facility-administered medications for this visit.   Allergies:  Patient has no known allergies.   Social History: The patient  reports that he has been smoking cigarettes. He has been smoking about 0.25 packs per day. He has never used smokeless tobacco. He reports current alcohol use. He reports that he does not use drugs.   Family History: The patient's family history includes Cirrhosis in his father; Hypertension in his brother and mother.   ROS:  Please see the history of present illness. Otherwise,  complete review of systems is positive for none.  All other systems are reviewed and negative.   Physical Exam: VS:  BP (!) 148/90   Pulse 73   Ht 5\' 10"  (1.778 m)   Wt 224 lb (101.6 kg)   SpO2 96%   BMI 32.14 kg/m , BMI Body mass index is 32.14 kg/m.  Wt Readings from Last 3 Encounters:  06/12/20 224 lb (101.6 kg)  05/21/19 226 lb (102.5 kg)  04/19/19 226 lb 3.2 oz (102.6  kg)    General: Patient appears comfortable at rest. Neck: Supple, no elevated JVP or carotid bruits, no thyromegaly. Lungs: Clear to auscultation, nonlabored breathing at rest. Cardiac: Regular rate and rhythm, no S3 or significant systolic murmur, no pericardial rub. Extremities: No pitting edema, distal pulses 2+. Skin: Warm and dry. Musculoskeletal: No kyphosis. Neuropsychiatric: Alert and oriented x3, affect grossly appropriate.  ECG:   06/12/2020.  EKG sinus rhythm with rate variation rate of 66, anteroseptal infarct, age undetermined.  Recent Labwork: No results found for requested labs within last 8760 hours.  No results found for: CHOL, TRIG, HDL, CHOLHDL, VLDL, LDLCALC, LDLDIRECT  Other Studies Reviewed Today:  CT chest 08/16/2019 IMPRESSION: 1. Stable 4.6 cm ascending thoracic aortic aneurysm. Recommend semi-annual imaging followup by CTA or MRA and referral to cardiothoracic surgery if not already obtained. This recommendation follows 2010 ACCF/AHA/AATS/ACR/ASA/SCA/SCAI/SIR/STS/SVM Guidelines for the Diagnosis and Management of Patients With Thoracic Aortic Disease. Circulation. 2010; 121: 2011 2. Coronary calcifications  CT chest 05/06/2019 IMPRESSION: 1. Fusiform dilation of the ascending aorta, measuring 4.7 cm in maximum diameter. Ascending thoracic aortic aneurysm. Recommend semi-annual imaging followup by CTA or MRA and referral to cardiothoracic surgery if not already obtained. This recommendation follows 2010 ACCF/AHA/AATS/ACR/ASA/SCA/SCAI/SIR/STS/SVM Guidelines for the Diagnosis and Management of Patients With Thoracic Aortic Disease. Circulation. 2010; 1212011. Aortic aneurysm NOS (ICD10-I71.9) 2. No evidence of aortic dissection. 3. No evidence of pulmonary embolus. 4. Calcific atherosclerotic disease of the coronary arteries.    NST 04/22/2019 Study Result Narrative & Impression   EKG is nondiagnostic due to baseline changes There was  transient downsloping ST segment depression ST segment depression of 1 mm was noted during stress in the II and III leads.  Small region of mild decreased tracer activity in the inferior wall (base, mid) consistent with scar and/ or soft tissue attenuation No ischemia  Nuclear stress EF: 50%.  Overall low risk study    Echocardiogram 04/22/2019 1. The left ventricle has normal systolic function with an ejection fraction of 60-65%. The cavity size was normal. Left ventricular diastolic Doppler parameters are consistent with impaired relaxation. 2. The right ventricle has normal systolic function. The cavity was normal. There is no increase in right ventricular wall thickness. 3. The aortic valve is grossly normal. Aortic valve regurgitation is trivial by color flow Doppler. 4. Proximal ascending aorta is mildly dilated at 44 mm.  Assessment and Plan:  1. Thoracic ascending aortic aneurysm (HCC)   2. DOE (dyspnea on exertion)   3. Essential hypertension   4. Tobacco use   5. Sleep-disordered breathing    1. Thoracic ascending aortic aneurysm (HCC) Last CTA showed 4.6 cm ascending thoracic aortic aneurysm 08/16/2019.  Please get repeat CTA to reassess aortic aneurysm.  Recommend semiannual studies.   2. DOE (dyspnea on exertion) Complaining of continuing dyspnea on exertion.  He is a long-term smoker and this may be contributing to the shortness of breath.  Last echocardiogram August 2020 demonstrated an EF of 60  to 65%.  Doppler parameters consistent with impaired relaxation.  Trivial AR.  Proximal ascending aorta  mildly dilated at 44 mm.  Offered referral to pulmonology for evaluation.  Patient wants to defer for now to see if blood pressure medication may help with his dyspnea which he describes as mild.  3. Essential hypertension Blood pressure remains elevated.  States he is compliant with all of his medications.  Blood pressure today is 148/90.  Patient states his blood pressure  has been as high as 170s over 100s.  We will add hydralazine 25 mg p.o. 3 times daily to current regimen.  Continue amlodipine 5 mg daily, labetalol 100 mg p.o. twice daily, losartan/hydrochlorothiazide 100/25 mg p.o. daily.  Advised to start measuring his blood pressure approximately 2 hours after taking all of his antihypertensive medications.  Advised him to rest 10 minutes prior to taking medications and not to smoke prior to checking his BP.  Advised him the goal is 130/80 or less.  He verbalizes understanding.  4. Tobacco use Continues tobacco use.  He is a long-term smoker.  States currently only smoking between 5 and 7 cigarettes/day states he can make a pack of cigarettes last approximately 3 days.  He mentions having dyspnea and we describes a possibility of having COPD due to long history of smoking and advised we could refer to pulmonary for evaluation.  He wants to defer for now to see if blood pressure medication may help.  Highly advised smoking cessation.  5. Sleep-disordered breathing Patient had a sleep study and has CPAP ordered and will start wearing the CPAP soon per his statement.  Medication Adjustments/Labs and Tests Ordered: Current medicines are reviewed at length with the patient today.  Concerns regarding medicines are outlined above.   Disposition: Follow-up with Dr. Wyline Mood or APP 1 month. Signed, Rennis Harding, NP 06/12/2020 10:45 AM    [facility] : [facility] [address] [place], [place], [place] [address] Phone: [phone]; Fax: [phone]

## 2020-06-12 ENCOUNTER — Encounter: Payer: Self-pay | Admitting: Family Medicine

## 2020-06-12 ENCOUNTER — Ambulatory Visit (INDEPENDENT_AMBULATORY_CARE_PROVIDER_SITE_OTHER): Payer: BC Managed Care – PPO | Admitting: Family Medicine

## 2020-06-12 VITALS — BP 148/90 | HR 73 | Ht 70.0 in | Wt 224.0 lb

## 2020-06-12 DIAGNOSIS — G4730 Sleep apnea, unspecified: Secondary | ICD-10-CM

## 2020-06-12 DIAGNOSIS — I712 Thoracic aortic aneurysm, without rupture, unspecified: Secondary | ICD-10-CM

## 2020-06-12 DIAGNOSIS — Z720 Tobacco use: Secondary | ICD-10-CM | POA: Diagnosis not present

## 2020-06-12 DIAGNOSIS — I10 Essential (primary) hypertension: Secondary | ICD-10-CM | POA: Diagnosis not present

## 2020-06-12 DIAGNOSIS — R0609 Other forms of dyspnea: Secondary | ICD-10-CM

## 2020-06-12 DIAGNOSIS — Z01812 Encounter for preprocedural laboratory examination: Secondary | ICD-10-CM

## 2020-06-12 DIAGNOSIS — R0600 Dyspnea, unspecified: Secondary | ICD-10-CM | POA: Diagnosis not present

## 2020-06-12 DIAGNOSIS — I7121 Aneurysm of the ascending aorta, without rupture: Secondary | ICD-10-CM

## 2020-06-12 MED ORDER — HYDRALAZINE HCL 25 MG PO TABS: 25.0000 mg | ORAL_TABLET | Freq: Three times a day (TID) | ORAL | 6 refills | Status: DC

## 2020-06-12 NOTE — Patient Instructions (Signed)
Medication Instructions:   Begin Hydralazine 25mg  three times per day.  Continue all other medications.    Labwork: BMET - order given today   Testing/Procedures:  CTA of chest / aorta   Office will contact with results via phone or letter.    Follow-Up: 1 month  Any Other Special Instructions Will Be Listed Below (If Applicable).  If you need a refill on your cardiac medications before your next appointment, please call your pharmacy.

## 2020-07-03 ENCOUNTER — Ambulatory Visit (HOSPITAL_COMMUNITY): Admission: RE | Admit: 2020-07-03 | Payer: BC Managed Care – PPO | Source: Ambulatory Visit

## 2020-07-12 NOTE — Progress Notes (Deleted)
Cardiology Office Note  Date: 07/12/2020   ID: Nicholas Mcknight, DOB 12-08-1960, MRN [id number]  PCP:  [facility]  Cardiologist:  No primary care provider on file. Electrophysiologist:  None   Chief Complaint: Follow-up dyspnea on exertion.  History of Present Illness: Nicholas Mcknight is a 59 y.o. male with a history of DOE, accelerated hypertension, sleep disordered breathing, CVA, HTN.  Thoracic ascending aortic aneurysm 4.6 cm last study on 08/16/2019.  Last seen by Dr. Purvis Sheffield 04/19/2019 for dyspnea on exertion.  He had been evaluated in the emergency department on 03/25/2019 with complaints of hypertension and headache.  Blood pressure was elevated in the emergency department at 178/115.  CBC and basic metabolic panel was unremarkable.  He had not taken his blood pressure medications on the day of ED visit.  Head CT showed evidence of subacute versus chronic lacunar infarct.  Minimal chronic small vessel ischemic changes within the white matter no acute intracranial abnormalities.  Had been feeling sluggish for several months.  Stated when he climbed a flight of stairs he quickly became short of breath.  He described poor sleep with snoring.  Waking up up feeling fatigue with morning headaches.  Echocardiogram was ordered.  Lexiscan Myoview stress test was ordered.  He was started on amlodipine daily and labetalol 100 mg p.o. twice daily.  Clonidine was stopped as well as Metoprolol.  He was continuing Hyzaar 100-25 mg daily.  He was referred for sleep study.  He was continuing aspirin for history of CVA and atorvastatin 40 mg was started.  He is here for 56-month follow-up today.  Describes some mild dyspnea on exertion, with occasional chest discomfort which is short-lived.  He denies any associated radiation to neck, arm, back or jaw when chest pain occurs.  Continues to smoke 5 to 7 cigarettes/day with a long-term history of smoking.  We spoke about possible  pulmonary referral.  His blood pressure is elevated on arrival at 148/90.  Denies any lightheadedness, dizziness, presyncopal or syncopal episodes.  Denies any CVA or TIA-like symptoms, PND or orthopnea, bleeding issues, palpitations or arrhythmias.  Has a history of thoracic aortic aneurysm with last CT done December 2020.  At that time aneurysm was 4.6 cm.  Denies any claudication-like symptoms, DVT or PE-like symptoms, or lower extremity edema.    Past Medical History:  Diagnosis Date  . Hypertension     No past surgical history on file.  Current Outpatient Medications  Medication Sig Dispense Refill  . amLODipine (NORVASC) 5 MG tablet Take 1 tablet (5 mg total) by mouth daily. 180 tablet 3  . atorvastatin (LIPITOR) 40 MG tablet Take 1 tablet (40 mg total) by mouth daily. 90 tablet 3  . hydrALAZINE (APRESOLINE) 25 MG tablet Take 1 tablet (25 mg total) by mouth 3 (three) times daily. 90 tablet 6  . labetalol (NORMODYNE) 100 MG tablet Take 1 tablet (100 mg total) by mouth 2 (two) times daily. 180 tablet 3  . losartan-hydrochlorothiazide (HYZAAR) 100-25 MG tablet Take 1 tablet by mouth daily. 30 tablet 0   No current facility-administered medications for this visit.   Allergies:  Patient has no known allergies.   Social History: The patient  reports that he has been smoking cigarettes. He has been smoking about 0.25 packs per day. He has never used smokeless tobacco. He reports current alcohol use. He reports that he does not use drugs.   Family History: The patient's family history includes  Cirrhosis in his father; Hypertension in his brother and mother.   ROS:  Please see the history of present illness. Otherwise, complete review of systems is positive for none.  All other systems are reviewed and negative.   Physical Exam: VS:  There were no vitals taken for this visit., BMI There is no height or weight on file to calculate BMI.  Wt Readings from Last 3 Encounters:  06/12/20 224 lb  (101.6 kg)  05/21/19 226 lb (102.5 kg)  04/19/19 226 lb 3.2 oz (102.6 kg)    General: Patient appears comfortable at rest. Neck: Supple, no elevated JVP or carotid bruits, no thyromegaly. Lungs: Clear to auscultation, nonlabored breathing at rest. Cardiac: Regular rate and rhythm, no S3 or significant systolic murmur, no pericardial rub. Extremities: No pitting edema, distal pulses 2+. Skin: Warm and dry. Musculoskeletal: No kyphosis. Neuropsychiatric: Alert and oriented x3, affect grossly appropriate.  ECG:   06/12/2020.  EKG sinus rhythm with rate variation rate of 66, anteroseptal infarct, age undetermined.  Recent Labwork: No results found for requested labs within last 8760 hours.  No results found for: CHOL, TRIG, HDL, CHOLHDL, VLDL, LDLCALC, LDLDIRECT  Other Studies Reviewed Today:  CT chest 08/16/2019 IMPRESSION: 1. Stable 4.6 cm ascending thoracic aortic aneurysm. Recommend semi-annual imaging followup by CTA or MRA and referral to cardiothoracic surgery if not already obtained. This recommendation follows 2010 ACCF/AHA/AATS/ACR/ASA/SCA/SCAI/SIR/STS/SVM Guidelines for the Diagnosis and Management of Patients With Thoracic Aortic Disease. Circulation. 2010; 121: Y195-K932 2. Coronary calcifications  CT chest 05/06/2019 IMPRESSION: 1. Fusiform dilation of the ascending aorta, measuring 4.7 cm in maximum diameter. Ascending thoracic aortic aneurysm. Recommend semi-annual imaging followup by CTA or MRA and referral to cardiothoracic surgery if not already obtained. This recommendation follows 2010 ACCF/AHA/AATS/ACR/ASA/SCA/SCAI/SIR/STS/SVM Guidelines for the Diagnosis and Management of Patients With Thoracic Aortic Disease. Circulation. 2010; 121: I712-W580. Aortic aneurysm NOS (ICD10-I71.9) 2. No evidence of aortic dissection. 3. No evidence of pulmonary embolus. 4. Calcific atherosclerotic disease of the coronary arteries.    NST 04/22/2019 Study Result Narrative  & Impression   EKG is nondiagnostic due to baseline changes There was transient downsloping ST segment depression ST segment depression of 1 mm was noted during stress in the II and III leads.  Small region of mild decreased tracer activity in the inferior wall (base, mid) consistent with scar and/ or soft tissue attenuation No ischemia  Nuclear stress EF: 50%.  Overall low risk study    Echocardiogram 04/22/2019 1. The left ventricle has normal systolic function with an ejection fraction of 60-65%. The cavity size was normal. Left ventricular diastolic Doppler parameters are consistent with impaired relaxation. 2. The right ventricle has normal systolic function. The cavity was normal. There is no increase in right ventricular wall thickness. 3. The aortic valve is grossly normal. Aortic valve regurgitation is trivial by color flow Doppler. 4. Proximal ascending aorta is mildly dilated at 44 mm.  Assessment and Plan:   1. Thoracic ascending aortic aneurysm (HCC) Last CTA showed 4.6 cm ascending thoracic aortic aneurysm 08/16/2019.  Please get repeat CTA to reassess aortic aneurysm.  Recommend semiannual studies.   2. DOE (dyspnea on exertion) Complaining of continuing dyspnea on exertion.  He is a long-term smoker and this may be contributing to the shortness of breath.  Last echocardiogram August 2020 demonstrated an EF of 60 to 65%.  Doppler parameters consistent with impaired relaxation.  Trivial AR.  Proximal ascending aorta  mildly dilated at 44 mm.  Offered referral to  pulmonology for evaluation.  Patient wants to defer for now to see if blood pressure medication may help with his dyspnea which he describes as mild.  3. Essential hypertension Blood pressure remains elevated.  States he is compliant with all of his medications.  Blood pressure today is 148/90.  Patient states his blood pressure has been as high as 170s over 100s.  We will add hydralazine 25 mg p.o. 3 times daily to  current regimen.  Continue amlodipine 5 mg daily, labetalol 100 mg p.o. twice daily, losartan/hydrochlorothiazide 100/25 mg p.o. daily.  Advised to start measuring his blood pressure approximately 2 hours after taking all of his antihypertensive medications.  Advised him to rest 10 minutes prior to taking medications and not to smoke prior to checking his BP.  Advised him the goal is 130/80 or less.  He verbalizes understanding.  4. Tobacco use Continues tobacco use.  He is a long-term smoker.  States currently only smoking between 5 and 7 cigarettes/day states he can make a pack of cigarettes last approximately 3 days.  He mentions having dyspnea and we describes a possibility of having COPD due to long history of smoking and advised we could refer to pulmonary for evaluation.  He wants to defer for now to see if blood pressure medication may help.  Highly advised smoking cessation.  5. Sleep-disordered breathing Patient had a sleep study and has CPAP ordered and will start wearing the CPAP soon per his statement.  Medication Adjustments/Labs and Tests Ordered: Current medicines are reviewed at length with the patient today.  Concerns regarding medicines are outlined above.   Disposition: Follow-up with Dr. Wyline Mood or APP 1 month. Signed, Rennis Harding, NP 07/12/2020 7:37 PM    [facility] [address] [place], [place], [place] [address] Phone: [phone]; Fax: [phone]

## 2020-07-13 ENCOUNTER — Ambulatory Visit: Payer: BC Managed Care – PPO | Admitting: Family Medicine

## 2020-07-14 ENCOUNTER — Encounter: Payer: Self-pay | Admitting: Family Medicine

## 2021-07-08 IMAGING — CT CT HEAD WITHOUT CONTRAST
4 series · 16 of 47 positions shown, 18 images · non-contrast
Comparison: None.

CLINICAL DATA: Acute headache with dizziness.

EXAM:
CT HEAD WITHOUT CONTRAST
TECHNIQUE: Contiguous axial images were obtained from the base of the skull
through the vertex without intravenous contrast.

[Series 2: head w o · axial · 0.44mm/px · z∈[+1513,+1633]mm · 7 of 32 slices shown, 9 images]
[im 4/32  brain]
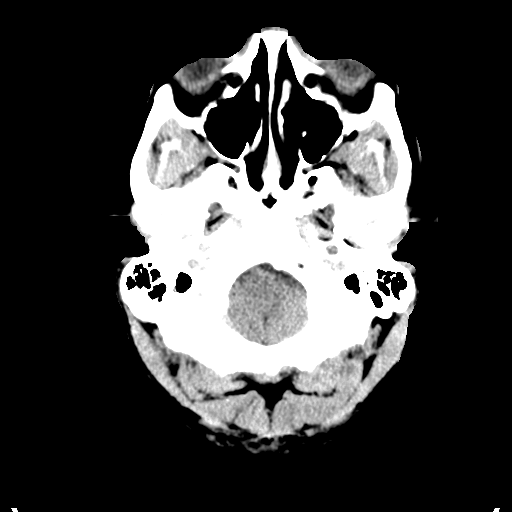
[im 4/32  bone]
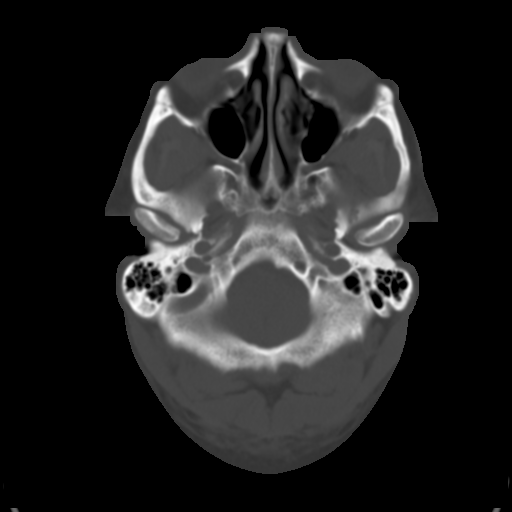
[im 8/32  brain]
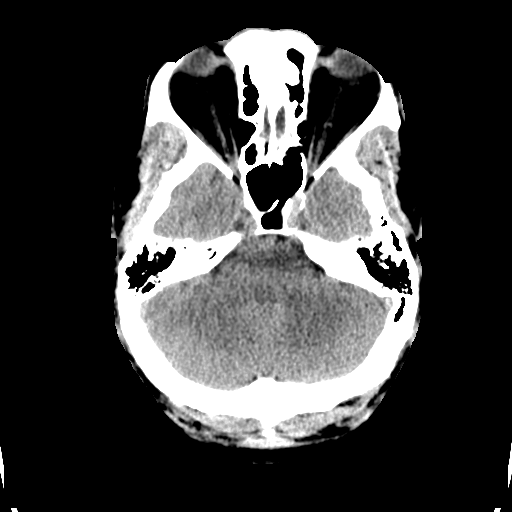
[im 12/32  brain]
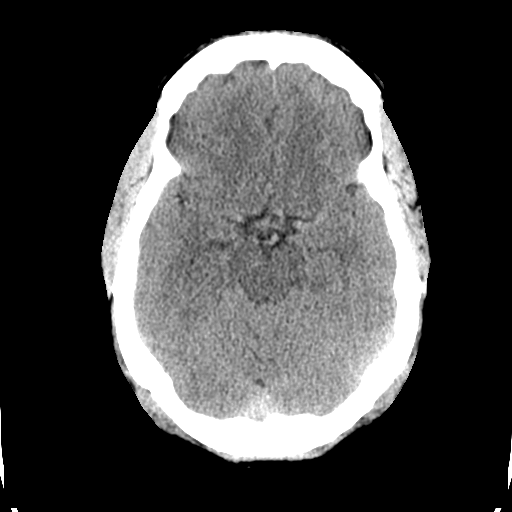
[im 16/32  brain]
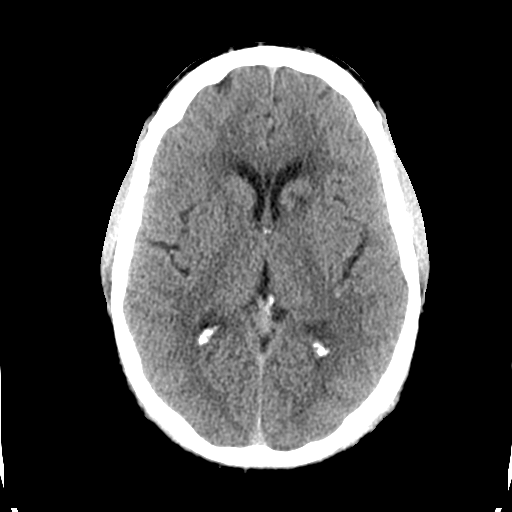
[im 20/32  brain]
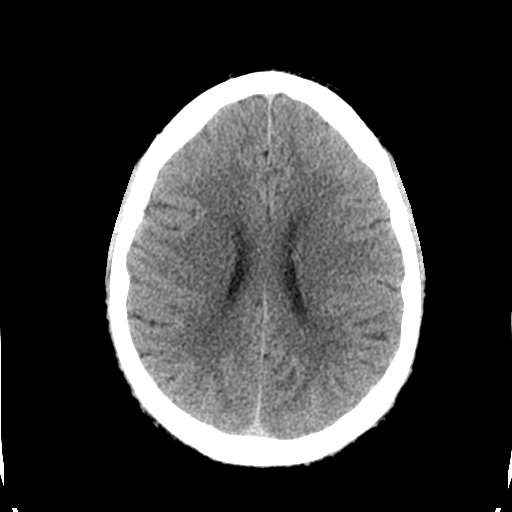
[im 20/32  bone]
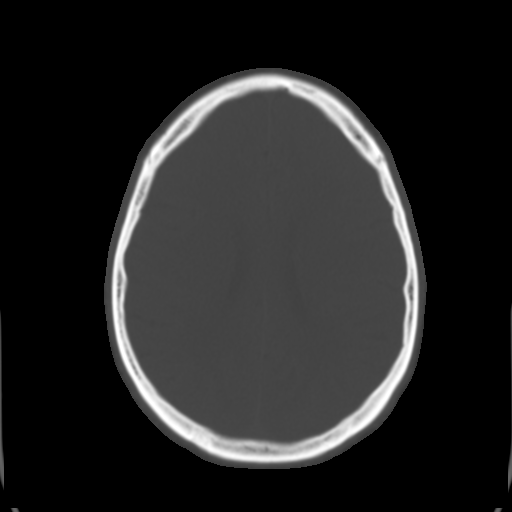
[im 24/32  brain]
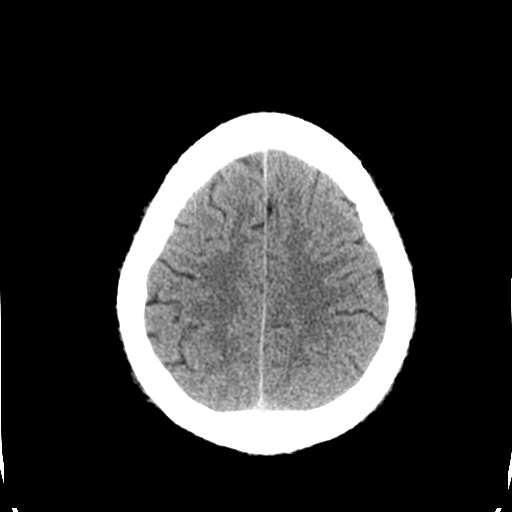
[im 28/32  brain]
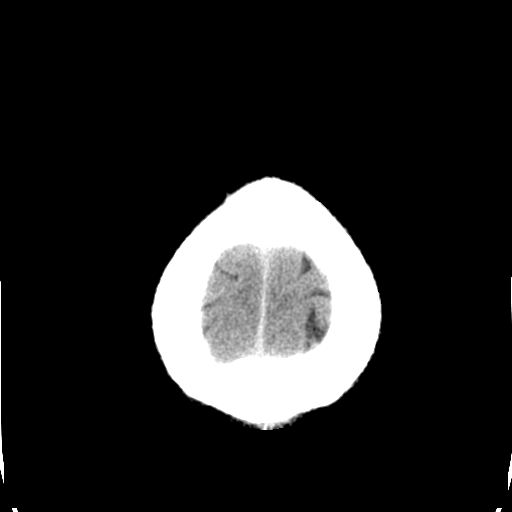

[Series 3: head bone · axial · 0.44mm/px · z∈[+1512,+1544]mm · 3 of 80 slices shown]
[im 8/80  bone]
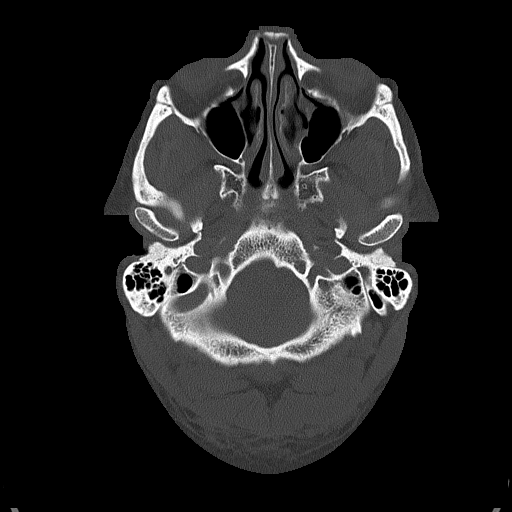
[im 16/80  bone]
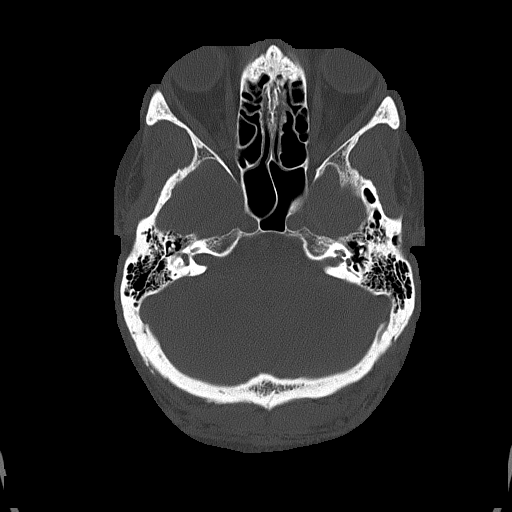
[im 24/80  bone]
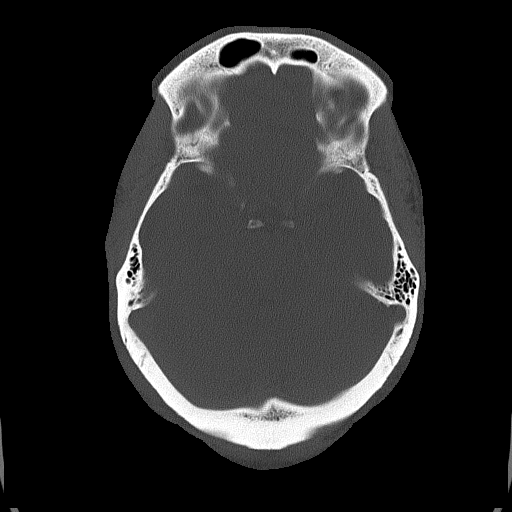

[Series 4: coronal soft · coronal · 0.33mm/px · 3 of 80 slices shown]
[im 27/80  brain]
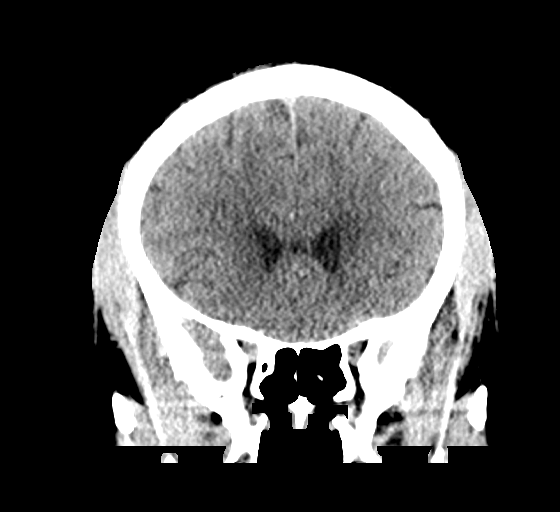
[im 36/80  brain]
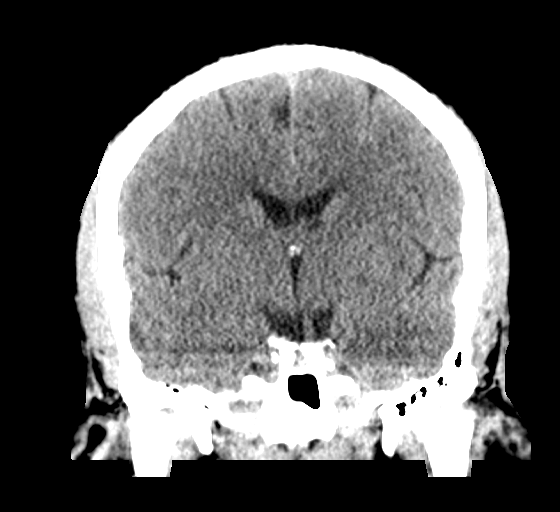
[im 44/80  brain]
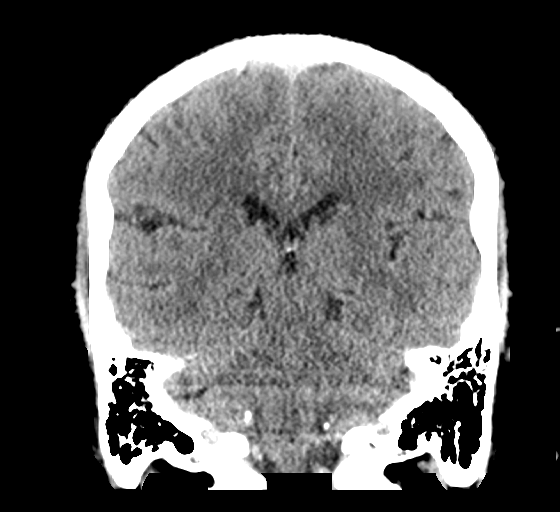

[Series 5: sagittal soft · sagittal · 0.36mm/px · 3 of 60 slices shown]
[im 20/60  brain]
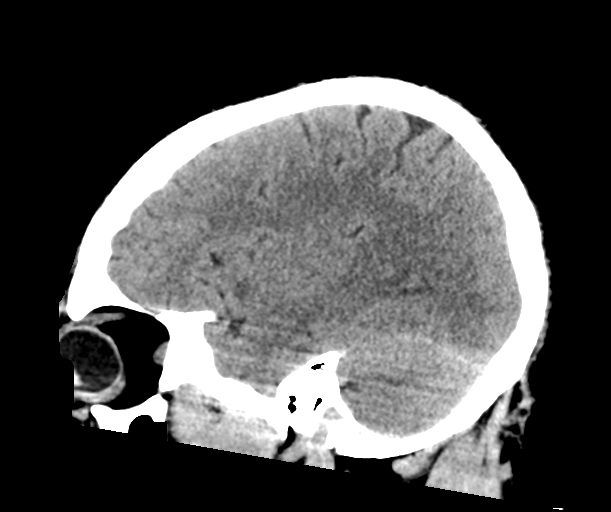
[im 30/60  brain]
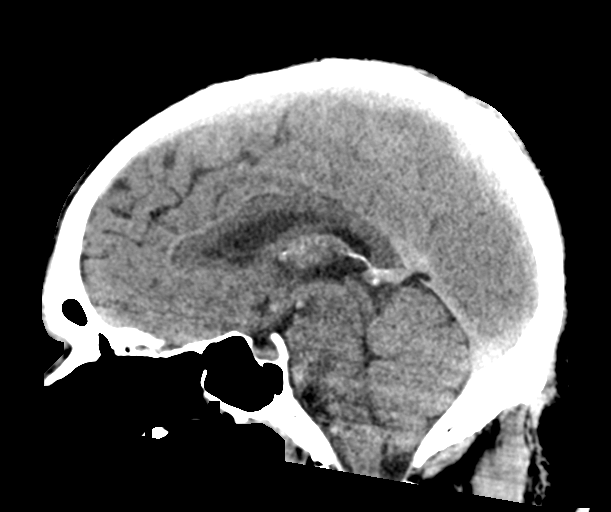
[im 40/60  brain]
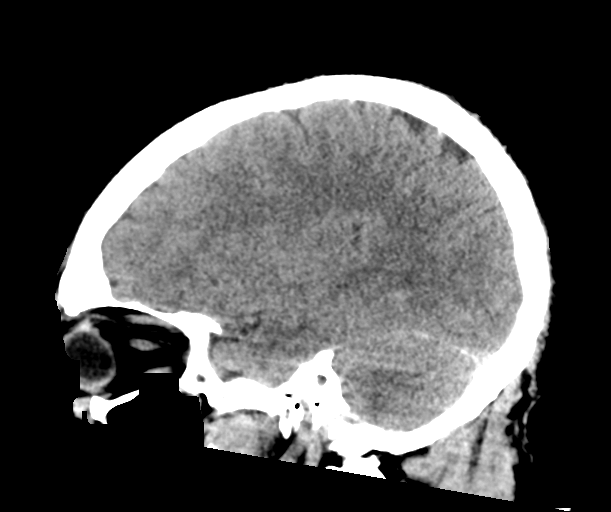

[16 of 47 positions shown; findings below may reference images not displayed]

FINDINGS: Brain: Ventricles are within normal limits in size and
configuration. Low-density focus within the LEFT basal ganglia
region, centered at the junction of the caudate head and internal
capsule, most suggestive of subacute or chronic lacunar infarct.
Minimal chronic small vessel ischemic changes within the bilateral
deep periventricular white matter regions.

No mass, hemorrhage, edema or other evidence of acute parenchymal
abnormality. No extra-axial hemorrhage.

Vascular: Chronic calcified atherosclerotic changes of the large
vessels at the skull base. No unexpected hyperdense vessel.

Skull: Normal. Negative for fracture or focal lesion.

Sinuses/Orbits: No acute finding.

Other: None.
IMPRESSION: 1. Low-density focus within the LEFT basal ganglia region, centered
at the junction of the caudate head and internal capsule, most
suggestive of subacute or chronic lacunar infarct.
2. Minimal chronic small vessel ischemic changes within the white
matter.
3. No evidence of acute intracranial abnormality. No intracranial
mass, hemorrhage or edema.

## 2021-08-18 IMAGING — CT CT ANGIO CHEST
2 of 6 series · 16 of 36 positions shown · IV contrast (omnipaque)
Comparison: None.

CLINICAL DATA: Follow-up of aortic aneurysm.

EXAM:
CT ANGIOGRAPHY CHEST WITH CONTRAST
TECHNIQUE: Multidetector CT imaging of the chest was performed using the
standard protocol during bolus administration of intravenous
contrast. Multiplanar CT image reconstructions and MIPs were
obtained to evaluate the vascular anatomy.
CONTRAST:  100mL OMNIPAQUE IOHEXOL 350 MG/ML SOLN

[Series 7: lungs · axial · 0.60mm/px · z∈[-164,+118]mm · 15 of 163 slices shown]
[im 11/163  lung]
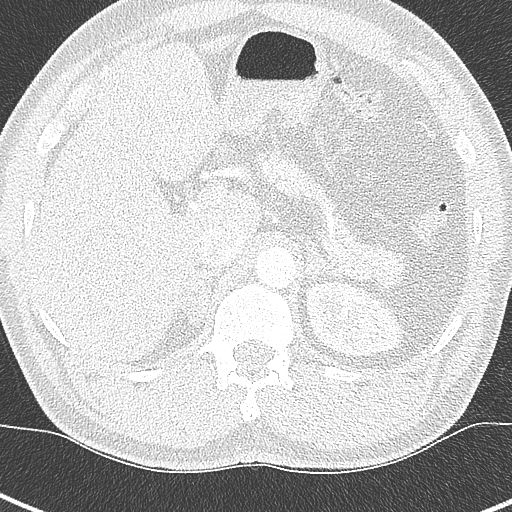
[im 21/163  mediastinal]
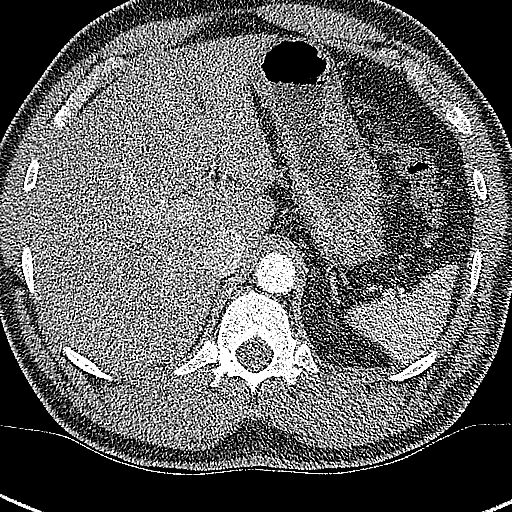
[im 31/163  lung]
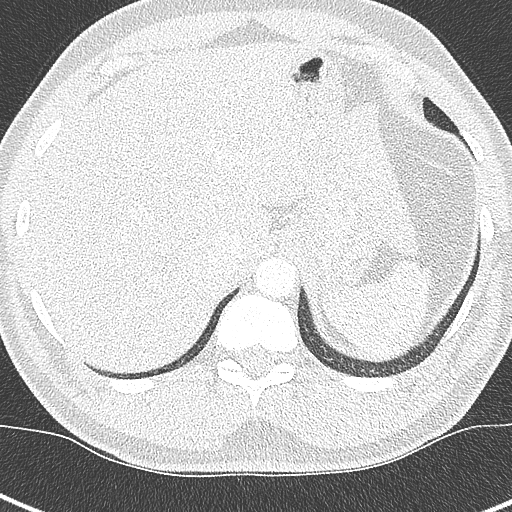
[im 41/163  mediastinal]
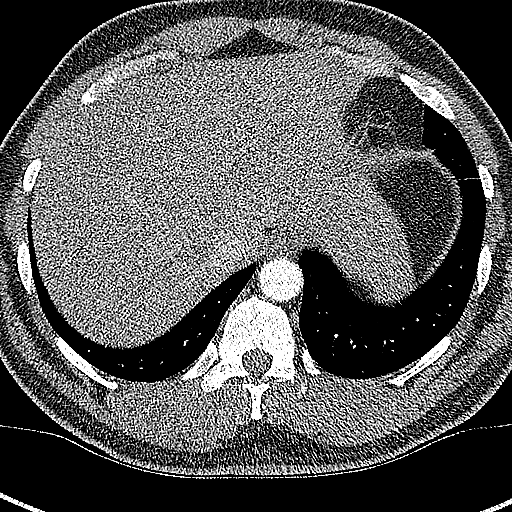
[im 51/163  lung]
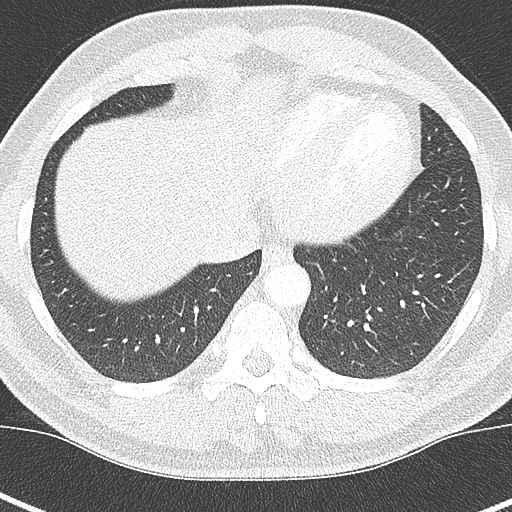
[im 61/163  mediastinal]
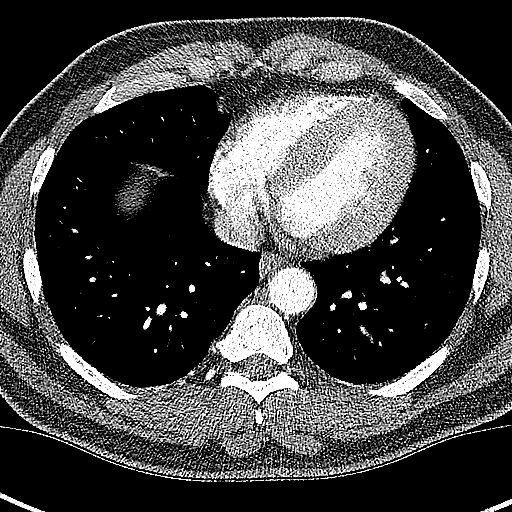
[im 71/163  lung]
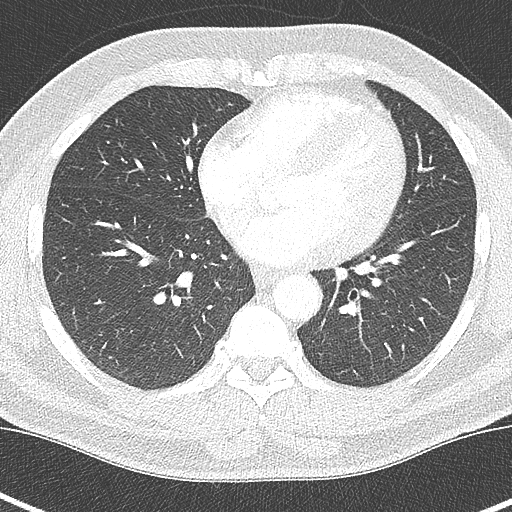
[im 82/163  mediastinal]
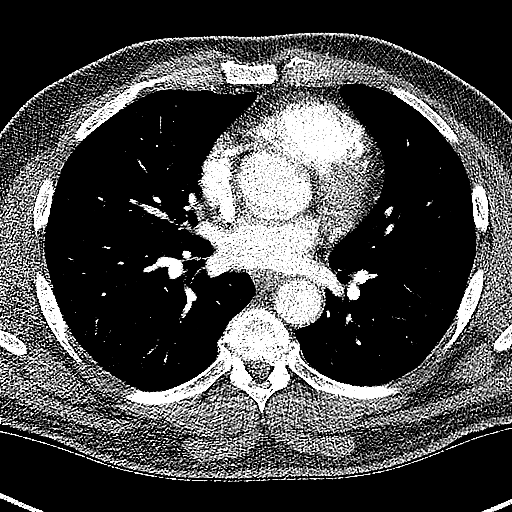
[im 92/163  lung]
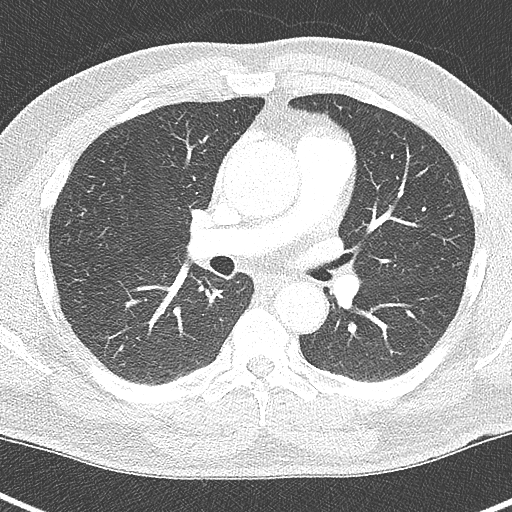
[im 102/163  mediastinal]
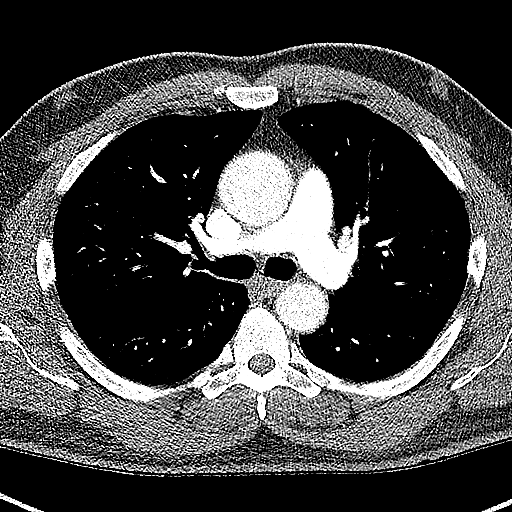
[im 112/163  lung]
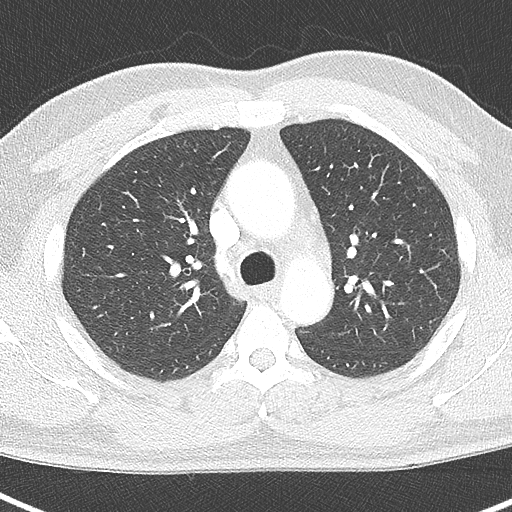
[im 122/163  mediastinal]
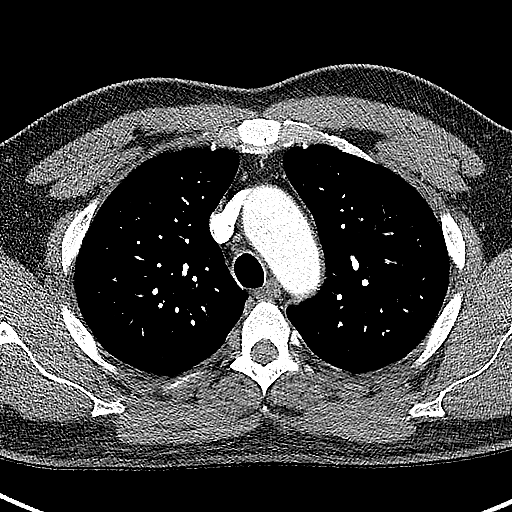
[im 132/163  lung]
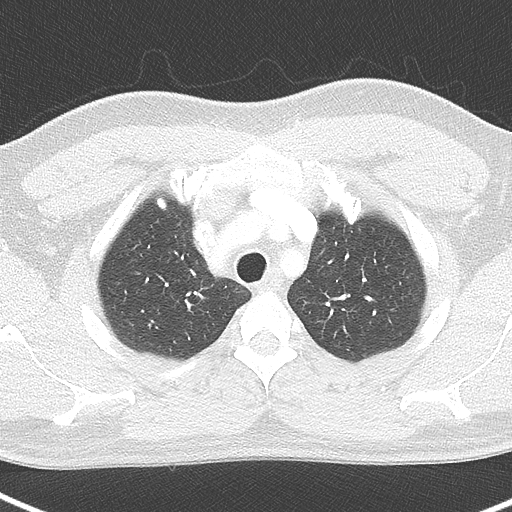
[im 142/163  mediastinal]
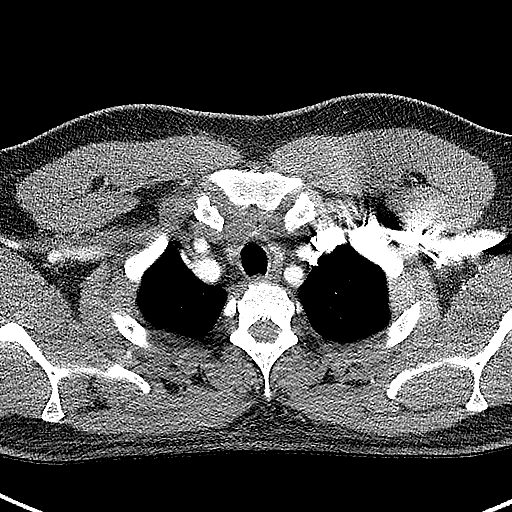
[im 152/163  lung]
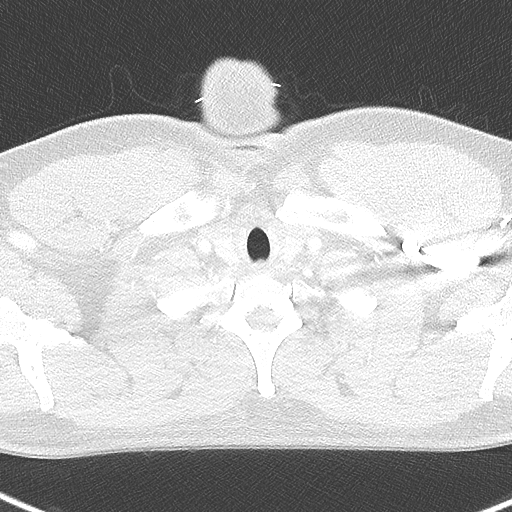

[Series 8: cor soft · coronal · 0.65mm/px · 1 of 131 slices shown]
[im 66/131  mediastinal]
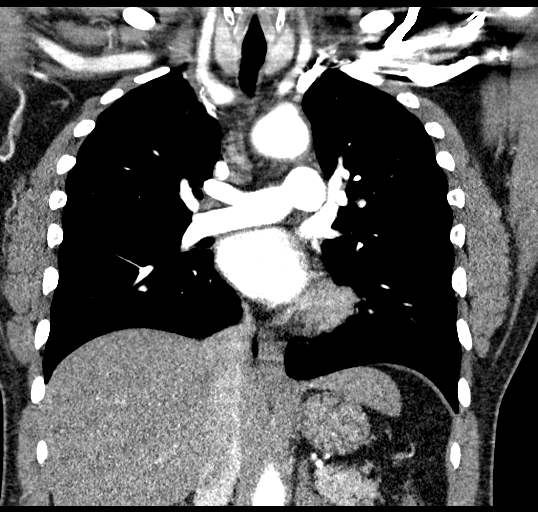

[16 of 36 positions shown; findings below may reference images not displayed]

FINDINGS: Cardiovascular: Satisfactory opacification of the pulmonary arteries
to the segmental level. No evidence of pulmonary embolism. Normal
heart size. No pericardial effusion. There is a fusiform dilation of
the ascending aorta, measuring 4.7 cm in maximum diameter. The
aortic arch measures 3.3 cm. Proximal descending aorta measures
cm. No evidence of dissection. Calcific atherosclerotic disease of
the coronary arteries.

Mediastinum/Nodes: No enlarged mediastinal, hilar, or axillary lymph
nodes. Thyroid gland, trachea, and esophagus demonstrate no
significant findings.

Lungs/Pleura: Lungs are clear. No pleural effusion or pneumothorax.

Upper Abdomen: No acute abnormality.

Musculoskeletal: No chest wall abnormality. No acute or significant
osseous findings.

Review of the MIP images confirms the above findings.
IMPRESSION: 1. Fusiform dilation of the ascending aorta, measuring 4.7 cm in
maximum diameter. Ascending thoracic aortic aneurysm. Recommend
semi-annual imaging followup by CTA or MRA and referral to
cardiothoracic surgery if not already obtained. This recommendation
follows 4515 ACCF/AHA/AATS/ACR/ASA/SCA/FELNER/HOUNG/PAULUS N/SOLES Guidelines
for the Diagnosis and Management of Patients With Thoracic Aortic
Disease. Circulation. 4515; 121: E266-e369. Aortic aneurysm NOS
([id number])
2. No evidence of aortic dissection.
3. No evidence of pulmonary embolus.
4. Calcific atherosclerotic disease of the coronary arteries.

## 2021-11-29 IMAGING — CT CT CHEST W/O CM
2 of 5 series · 12 of 36 positions shown, 15 images · non-contrast
Comparison: 05/05/2019

CLINICAL DATA: Thoracic aortic aneurysm

EXAM:
CT CHEST WITHOUT CONTRAST
TECHNIQUE: Multidetector CT imaging of the chest was performed following the
standard protocol without IV contrast.

[Series 8: chest 2.00 br60 s3 · axial · 0.60mm/px · z∈[+1528,+1762]mm · 9 of 144 slices shown, 12 images]
[im 14/144  mediastinal]
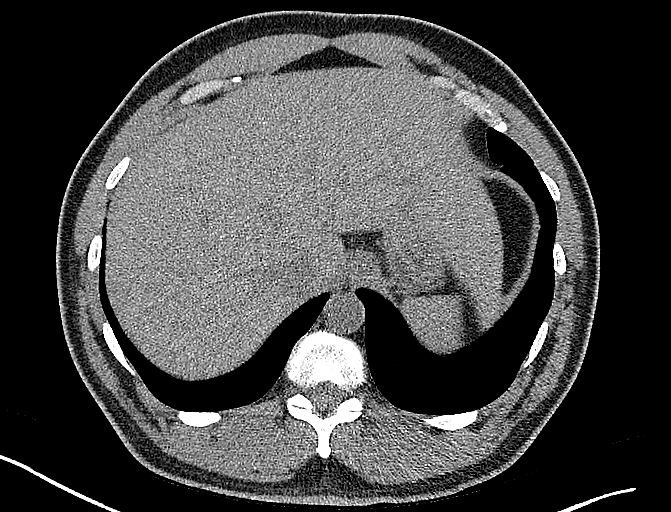
[im 14/144  lung]
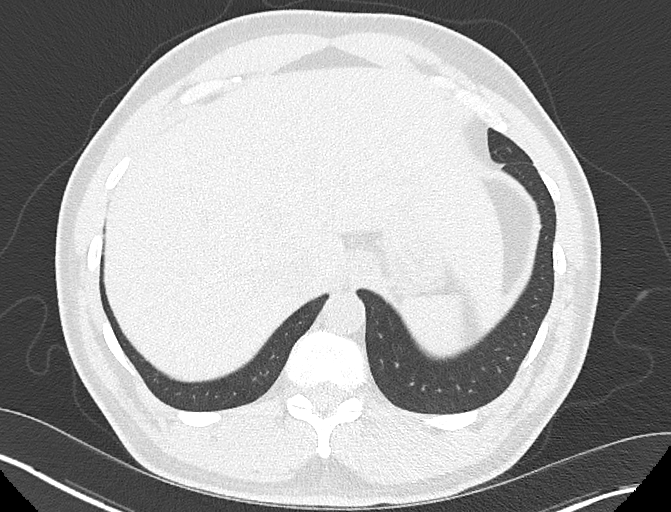
[im 27/144  lung]
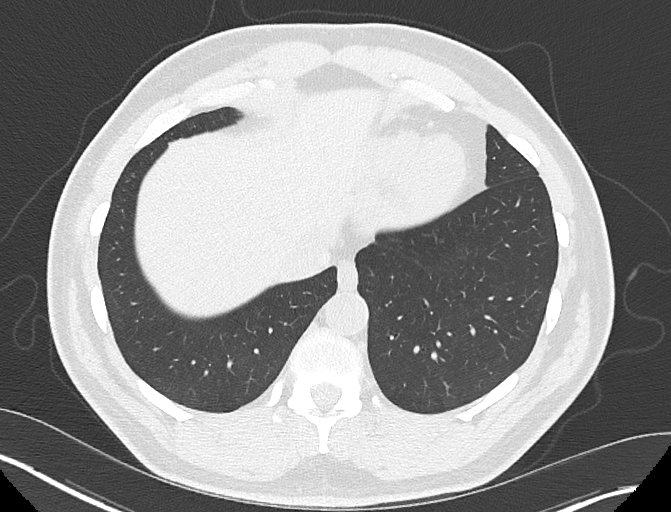
[im 40/144  lung]
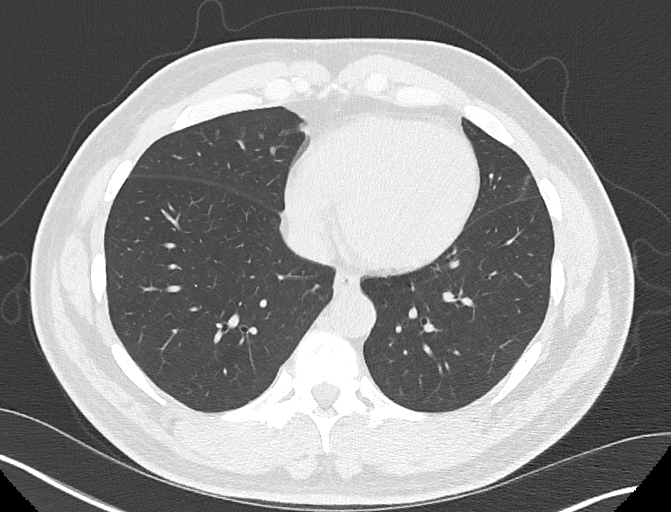
[im 53/144  lung]
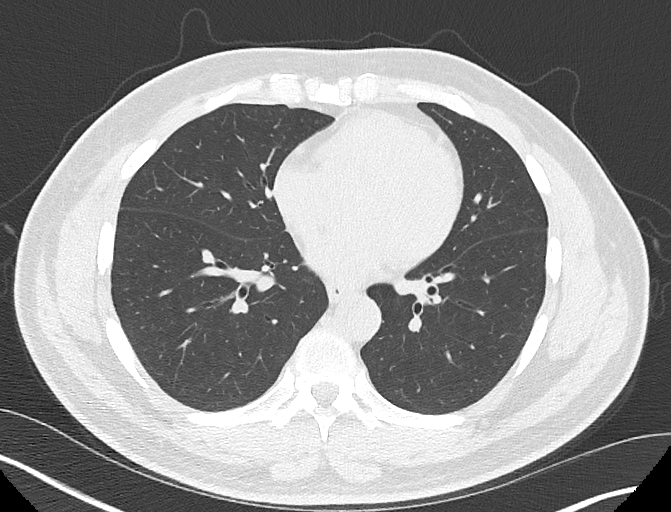
[im 79/144  mediastinal]
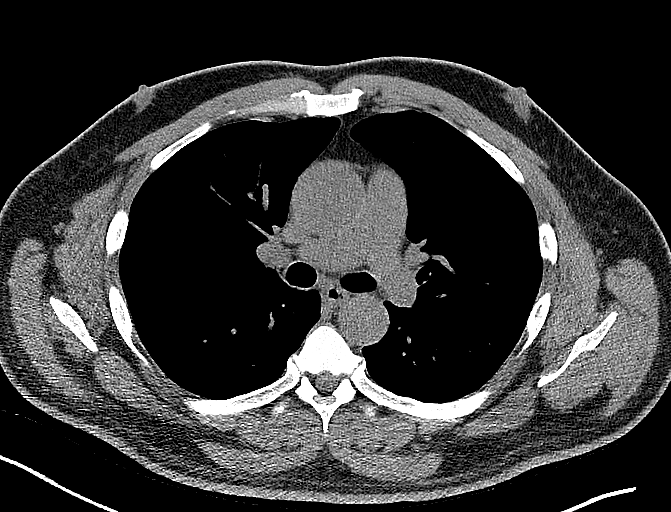
[im 79/144  lung]
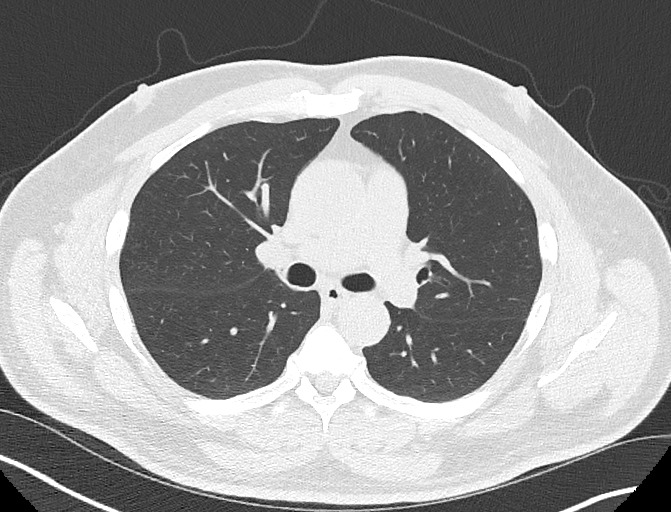
[im 92/144  lung]
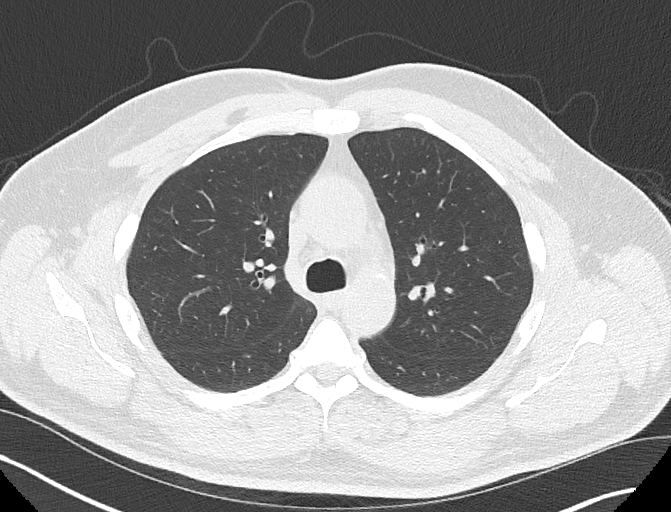
[im 105/144  lung]
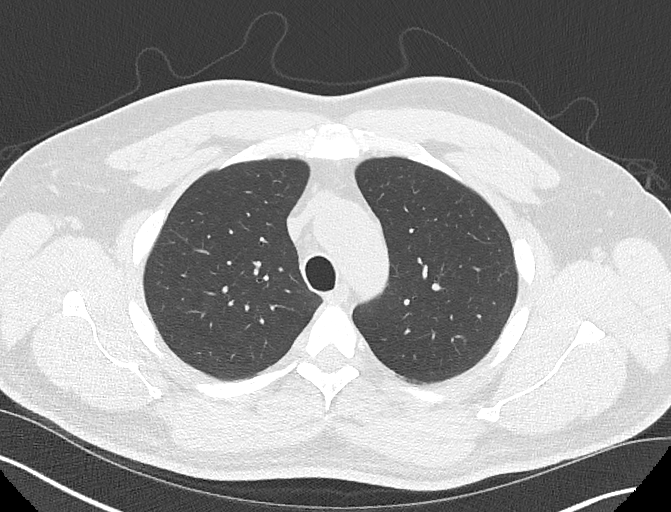
[im 118/144  lung]
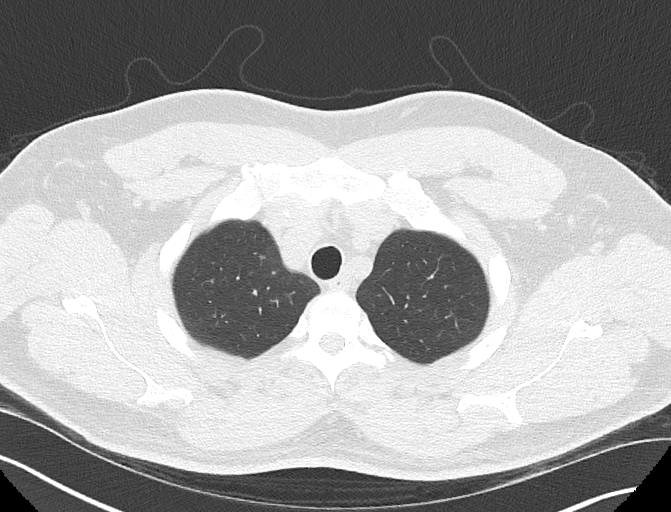
[im 131/144  mediastinal]
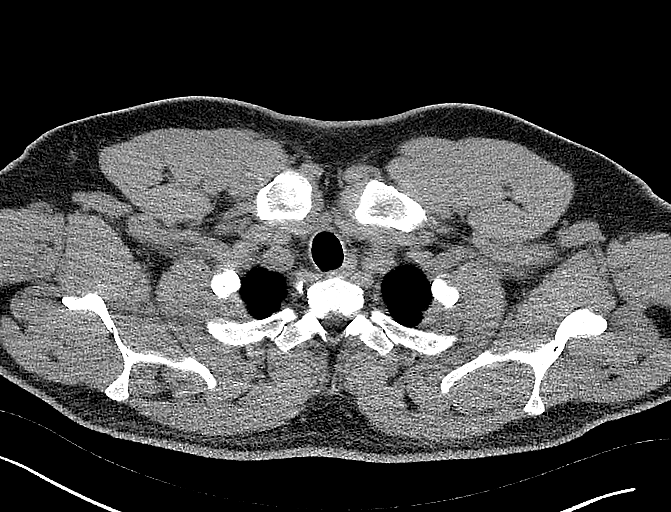
[im 131/144  lung]
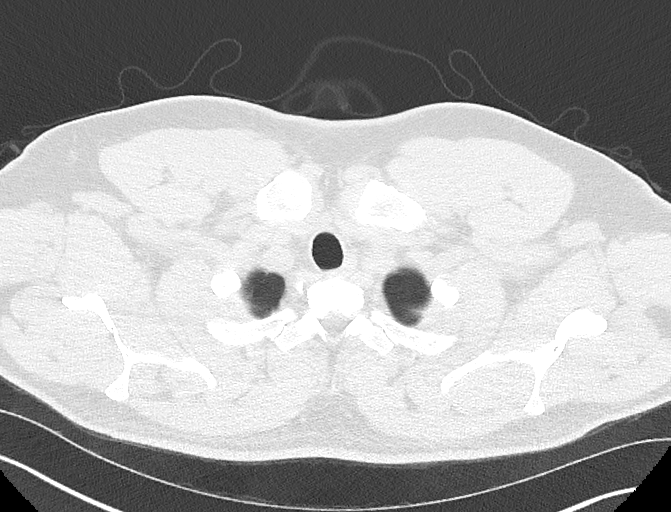

[Series 13: chest 2.00 br40 s3 · coronal · 0.56mm/px · 3 of 153 slices shown]
[im 31/153  lung]
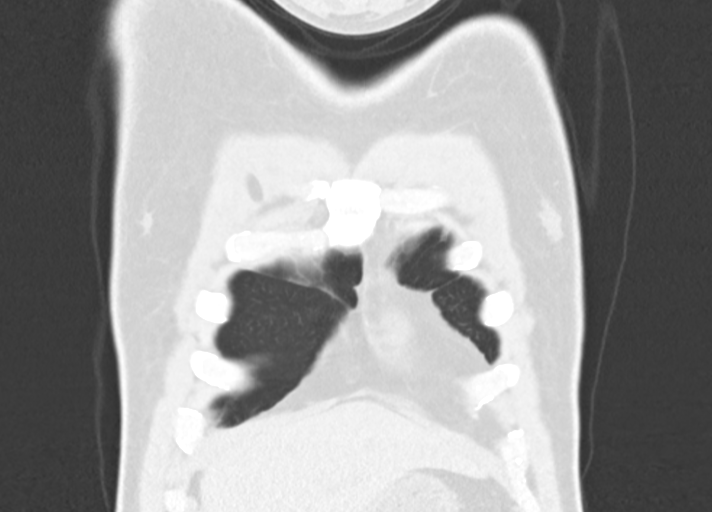
[im 61/153  lung]
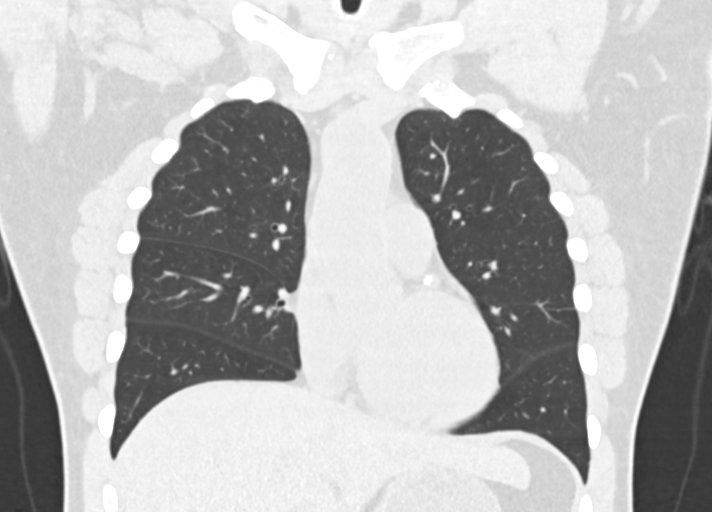
[im 92/153  lung]
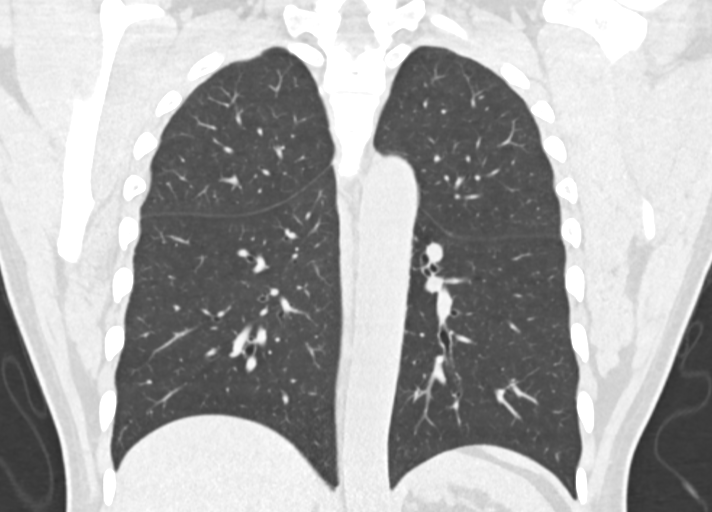

[12 of 36 positions shown; findings below may reference images not displayed]

FINDINGS: Cardiovascular: Heart size normal. No pericardial effusion. Central
pulmonary arteries normal in caliber.

Moderate coronary calcifications

Fusiform dilatation of the ascending thoracic aorta up to 4.6 cm
(previously 4.7), proximal arch 3.7 cm, distal arch 3 cm. No
displaced intimal calcifications or other suggestion of dissection,
sensitivity decreased without IV contrast.

Mediastinum/Nodes: No hilar or  mediastinal adenopathy.

Lungs/Pleura: No pleural effusion.  No pneumothorax.  Lungs clear.

Upper Abdomen: No acute findings.

Musculoskeletal: No acute abnormality.
IMPRESSION: 1. Stable 4.6 cm ascending thoracic aortic aneurysm. Recommend
semi-annual imaging followup by CTA or MRA and referral to
cardiothoracic surgery if not already obtained. This recommendation
follows 3868 ACCF/AHA/AATS/ACR/ASA/SCA/LSGENDEROV/LAMING/AMMARO/EFRAIN Guidelines
for the Diagnosis and Management of Patients With Thoracic Aortic
Disease. Circulation. 3868; 121: e266-e369
2. Coronary calcifications

## 2022-10-28 ENCOUNTER — Other Ambulatory Visit (HOSPITAL_COMMUNITY): Payer: Self-pay | Admitting: Family Medicine

## 2022-10-28 DIAGNOSIS — I739 Peripheral vascular disease, unspecified: Secondary | ICD-10-CM

## 2022-10-29 ENCOUNTER — Other Ambulatory Visit (HOSPITAL_COMMUNITY): Payer: Self-pay | Admitting: Family Medicine

## 2022-10-29 ENCOUNTER — Encounter (HOSPITAL_COMMUNITY): Payer: Self-pay

## 2022-10-29 ENCOUNTER — Emergency Department (HOSPITAL_COMMUNITY): Payer: BC Managed Care – PPO

## 2022-10-29 ENCOUNTER — Other Ambulatory Visit: Payer: Self-pay

## 2022-10-29 ENCOUNTER — Ambulatory Visit (HOSPITAL_COMMUNITY)
Admission: RE | Admit: 2022-10-29 | Discharge: 2022-10-29 | Disposition: A | Discharge status: Home / Self Care | Payer: BC Managed Care – PPO | Source: Ambulatory Visit | Attending: Family Medicine | Admitting: Family Medicine

## 2022-10-29 ENCOUNTER — Emergency Department (HOSPITAL_COMMUNITY)
Admission: EM | Admit: 2022-10-29 | Discharge: 2022-10-29 | Disposition: A | Discharge status: Home / Self Care | Payer: BC Managed Care – PPO | Attending: Student | Admitting: Student

## 2022-10-29 DIAGNOSIS — Z79899 Other long term (current) drug therapy: Secondary | ICD-10-CM | POA: Diagnosis not present

## 2022-10-29 DIAGNOSIS — I739 Peripheral vascular disease, unspecified: Secondary | ICD-10-CM

## 2022-10-29 DIAGNOSIS — I728 Aneurysm of other specified arteries: Secondary | ICD-10-CM | POA: Insufficient documentation

## 2022-10-29 DIAGNOSIS — M79605 Pain in left leg: Secondary | ICD-10-CM | POA: Diagnosis present

## 2022-10-29 DIAGNOSIS — I10 Essential (primary) hypertension: Secondary | ICD-10-CM | POA: Insufficient documentation

## 2022-10-29 DIAGNOSIS — I729 Aneurysm of unspecified site: Secondary | ICD-10-CM

## 2022-10-29 LAB — I-STAT CREATININE, ED: Creatinine, Ser: 1.1 mg/dL (ref 0.61–1.24)

## 2022-10-29 MED ORDER — IOHEXOL 350 MG/ML SOLN
100.0000 mL | Freq: Once | INTRAVENOUS | Status: AC | PRN
  Administered 2022-10-29: 100 mL via INTRAVENOUS

## 2022-10-29 NOTE — ED Triage Notes (Signed)
Per [place] report  "No evidence of deep venous thrombosis seen in left lower extremity. 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 Arterial study is recommended for further evaluation."

## 2022-10-29 NOTE — ED Notes (Signed)
Pt awaiting CT results.

## 2022-10-29 NOTE — Discharge Instructions (Signed)
Continue taking your baby aspirin and your cholesterol medicine.  Dr. Stephens [facility] office should be calling you for an office visit for this week.  I have given you his information however please call him if you have not heard from them within the next 24 hours to schedule this appointment.

## 2022-10-29 NOTE — ED Triage Notes (Signed)
Pt sent by [place] due to c/o of L lower leg pain when ambulating. Pain began in November and has gotten progressively worse since.

## 2022-10-29 NOTE — ED Notes (Signed)
ED Provider at bedside. Pt awake talking with provider.  In good spirits and laughing

## 2022-10-29 NOTE — ED Notes (Signed)
Patient transported to CT 

## 2022-10-29 NOTE — ED Provider Notes (Signed)
[facility] Provider Note   CSN: [id number] Arrival date & time: 10/29/22  1413     History  No chief complaint on file.   Nicholas Mcknight is a 62 y.o. male with a history including hypertension only presenting for evaluation of left lower extremity pain which he describes as sharp aching pain in his left lateral calf area which is triggered with ambulation and better at rest.  He began having his symptoms approximately 4 months ago.  He was sent here by his PCP today for an outpatient DVT study which was negative however there was suggestion of a popliteal artery aneurysm so are not arterial ultrasound was added.  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  He does state he is symptom-free at rest.  He is a smoker.  The history is provided by the patient.       Home Medications Prior to Admission medications   Medication Sig Start Date End Date Taking? Authorizing Provider  amLODipine (NORVASC) 5 MG tablet Take 1 tablet (5 mg total) by mouth daily. 04/19/19 06/12/20  Herminio Commons, MD  atorvastatin (LIPITOR) 40 MG tablet Take 1 tablet (40 mg total) by mouth daily. 04/19/19 06/12/20  Herminio Commons, MD  hydrALAZINE (APRESOLINE) 25 MG tablet Take 1 tablet (25 mg total) by mouth 3 (three) times daily. 06/12/20 09/10/20  Verta Ellen., NP  labetalol (NORMODYNE) 100 MG tablet Take 1 tablet (100 mg total) by mouth 2 (two) times daily. 04/19/19   Herminio Commons, MD  losartan-hydrochlorothiazide (HYZAAR) 100-25 MG tablet Take 1 tablet by mouth daily. 05/29/17   Orlie Dakin, MD      Allergies    Patient has no known allergies.    Review of Systems   Review of Systems  Constitutional:  Negative for chills and fever.  HENT:  Negative for congestion and sore throat.   Eyes: Negative.   Respiratory:  Negative for chest  tightness and shortness of breath.   Cardiovascular:  Negative for chest pain.  Gastrointestinal:  Negative for abdominal pain and nausea.  Genitourinary: Negative.   Musculoskeletal:  Positive for myalgias. Negative for joint swelling and neck pain.  Skin: Negative.  Negative for color change, rash and wound.  Neurological:  Negative for dizziness, weakness, light-headedness, numbness and headaches.  Psychiatric/Behavioral: Negative.    All other systems reviewed and are negative.   Physical Exam Updated Vital Signs BP (!) 162/102 (BP Location: Right Arm)   Pulse 72   Temp 98 F (36.7 C) (Oral)   Resp 17   Ht '5\' 10"'$  (1.778 m)   Wt 102.1 kg   SpO2 99%   BMI 32.28 kg/m  Physical Exam Vitals and nursing note reviewed.  Constitutional:      Appearance: He is well-developed.  HENT:     Head: Normocephalic and atraumatic.  Eyes:     Conjunctiva/sclera: Conjunctivae normal.  Cardiovascular:     Rate and Rhythm: Normal rate and regular rhythm.     Pulses:          Dorsalis pedis pulses are 2+ on the right side and 2+ on the left side.     Heart sounds: Normal heart sounds.  Pulmonary:     Effort: Pulmonary effort is normal.     Breath sounds: Normal breath sounds. No wheezing.  Abdominal:     General: Bowel sounds  are normal.     Palpations: Abdomen is soft.     Tenderness: There is no abdominal tenderness.  Musculoskeletal:        General: No swelling or tenderness. Normal range of motion.     Cervical back: Normal range of motion.     Right lower leg: No edema.     Left lower leg: No edema.     Comments: Legs appear relatively uniform in size.  Skin:    General: Skin is warm and dry.  Neurological:     Mental Status: He is alert.     ED Results / Procedures / Treatments   Labs (all labs ordered are listed, but only abnormal results are displayed) Labs Reviewed  I-STAT CREATININE, ED    EKG None  Radiology CT ANGIO LOWER EXT BILAT W &/OR WO  CONTRAST  Result Date: 10/29/2022 CLINICAL DATA:  Left popliteal aneurysm with thrombosis EXAM: CT ANGIOGRAPHY OF ABDOMINAL AORTA WITH ILIOFEMORAL RUNOFF TECHNIQUE: Multidetector CT imaging of the abdomen, pelvis and lower extremities was performed using the standard protocol during bolus administration of intravenous contrast. Multiplanar CT image reconstructions and MIPs were obtained to evaluate the vascular anatomy. RADIATION DOSE REDUCTION: This exam was performed according to the departmental dose-optimization program which includes automated exposure control, adjustment of the mA and/or kV according to patient size and/or use of iterative reconstruction technique. CONTRAST:  144m OMNIPAQUE IOHEXOL 350 MG/ML SOLN COMPARISON:  Vascular ultrasound obtained earlier today FINDINGS: VASCULAR Aorta: Distal aorta is normal in caliber. IMA: Patent without evidence of aneurysm, dissection, vasculitis or significant stenosis. RIGHT Lower Extremity Inflow: Mild aneurysmal dilation of the common iliac artery to 1.8 cm. Scattered atherosclerotic plaque without aneurysm or dissection. Outflow: Common, superficial and profunda femoral arteries and the popliteal artery are patent without evidence of aneurysm, dissection, vasculitis or significant stenosis. Runoff: Patent three vessel runoff to the ankle. 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 Scattered atherosclerotic plaque. Mild to moderate stenosis of the mid external iliac artery. Outflow: The common, profunda and superficial femoral arteries are all normal. Abnormal appearance of the popliteal artery. The popliteal artery abruptly occludes at the level of the patella (P2 segment) and then RTimber Coveis but with delayed contrast enhancement. There is a rounded region in the area measuring approximately 2.6 x 2.2 cm likely representing the thrombosed aneurysm. However, there is also a suggestion of displacement of  the popliteal artery as though from external compression. Runoff: Poor evaluation of the runoff arteries due to delayed contrast bolus from the popliteal occlusion. Veins: No focal venous abnormality. Review of the MIP images confirms the above findings. NON-VASCULAR Lower chest: Not visualized Hepatobiliary: Not visualized Pancreas: Visualized portion is unremarkable. Spleen: Not visualized. Adrenals/Urinary Tract: Visualized portions of the kidneys, ureters and bladder are unremarkable. Stomach/Bowel: Visualized bowel demonstrates no focal wall thickening or evidence of obstruction. No mass lesion. Normal appendix. Lymphatic: No suspicious lymphadenopathy. Reproductive: Prostatomegaly. Other: No abdominal wall hernia or abnormality. No abdominopelvic ascites. Musculoskeletal: No acute or significant osseous findings. IMPRESSION: VASCULAR 1. Focal occlusion of the P2 segment of the popliteal artery with reconstitution at the knee joint and relatively delayed contrast enhancement of the remaining popliteal artery and runoff arteries. The occlusion is in conjunction with a ill-defined rounded soft tissue region which is very difficult to see and blends in with the adjacent gastrocnemius musculature. Differential considerations include thrombosed popliteal artery aneurysm versus complex adventitial cystic disease, or less likely  external compression from a soft tissue mass. The ultrasound appearance remains most suggestive of thrombosed aneurysm. MRI of the knee with and without gadolinium contrast could be considered for confirmation if clinically warranted. 2. Additional scattered atherosclerotic plaque including in the distal aorta. Aortic Atherosclerosis (ICD10-I70.0). 3. 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 NON-VASCULAR 1. No acute abnormality in the visualized abdomen/pelvis. 2. Prostatomegaly. Signed, Criselda Peaches, MD, [place]  [facility] Radiology Electronically Signed   By: Jacqulynn Cadet M.D.   On: 10/29/2022 17:04   US ARTERIAL LOWER EXTREMITY DUPLEX LEFT (NON-ABI)  Result Date: 10/29/2022 CLINICAL DATA:  Intermittent claudication EXAM: LEFT LOWER EXTREMITY ARTERIAL DUPLEX SCAN TECHNIQUE: Gray-scale sonography as well as color Doppler and duplex ultrasound was performed to evaluate the lower extremity arteries including the common, superficial and profunda femoral arteries, popliteal artery and calf arteries. COMPARISON:  None Available. FINDINGS: Left lower Extremity Inflow: Normal common femoral arterial waveforms and velocities. No evidence of inflow (aortoiliac) disease. Outflow: Aneurysmal dilation of the popliteal artery is present measuring up to 2.3 cm in diameter. No flow is visualized within the popliteal artery consistent with a thrombosed aneurysm. The arterial waveforms in the superficial femoral artery beginning normal but become abnormal and demonstrated pre occlusive pattern in the distal thigh. Runoff: Abnormal monophasic arterial waveforms with sluggish flow. IMPRESSION: 1. Thrombosed 2.3 cm popliteal artery aneurysm results in focal occlusive disease of the distal femoropopliteal segment. 2. Otherwise, scattered mild atherosclerotic plaque without significant stenosis. Signed, Criselda Peaches, MD, [place] [facility] Radiology Electronically Signed   By: Jacqulynn Cadet M.D.   On: 10/29/2022 14:00   US Venous Img Lower Unilateral Left (DVT)  Result Date: 10/29/2022 CLINICAL DATA:  Claudication. EXAM: Left LOWER EXTREMITY VENOUS DOPPLER ULTRASOUND TECHNIQUE: Gray-scale sonography with compression, as well as color and duplex ultrasound, were performed to evaluate the deep venous system(s) from the level of the common femoral vein through the popliteal and proximal calf veins. COMPARISON:  None Available.  FINDINGS: VENOUS Normal compressibility of the common femoral, superficial femoral, and popliteal veins, as well as the visualized calf veins. Visualized portions of profunda femoral vein and great saphenous vein unremarkable. No filling defects to suggest DVT on grayscale or color Doppler imaging. Doppler waveforms show normal direction of venous flow, normal respiratory plasticity and response to augmentation. Limited views of the contralateral common femoral vein are unremarkable. OTHER Possible popliteal artery aneurysm is noted. Limitations: none IMPRESSION: No evidence of deep venous thrombosis seen in left lower extremity. 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 Arterial study is recommended for further evaluation. Electronically Signed   By: Marijo Conception M.D.   On: 10/29/2022 13:57    Procedures Procedures    Medications Ordered in ED Medications  iohexol (OMNIPAQUE) 350 MG/ML injection 100 mL (100 mLs Intravenous Contrast Given 10/29/22 1642)    ED Course/ Medical Decision Making/ A&P                             Medical Decision Making Pt with documented popliteal artery aneurysm with clot left lower leg,  several month hx of claudication sx left.   Amount and/or Complexity of Data Reviewed Discussion of management or test interpretation with external provider(s): Patient discussed with Dr. Blanche East vascular surgery.  He has asked for a CT  angio of this extremity prior to discharge home.  Make sure he is on a baby aspirin and a statin and he will plan to have him seen in his office this week.  Discussed this plan with patient and he is agreeable.  He is actually already taking a daily baby aspirin and is on atorvastatin 40 mg daily.           Final Clinical Impression(s) / ED Diagnoses Final diagnoses:  Arterial aneurysm [facility])    Rx / DC Orders ED Discharge Orders     None         Landis Martins 10/29/22 1711    Kommor, Debe Coder,  MD 10/30/22 1623

## 2022-10-30 ENCOUNTER — Encounter: Payer: Self-pay | Admitting: Vascular Surgery

## 2022-10-30 ENCOUNTER — Ambulatory Visit (INDEPENDENT_AMBULATORY_CARE_PROVIDER_SITE_OTHER): Payer: BC Managed Care – PPO | Admitting: Vascular Surgery

## 2022-10-30 ENCOUNTER — Other Ambulatory Visit: Payer: Self-pay

## 2022-10-30 VITALS — BP 162/98 | HR 73 | Temp 98.3°F | Resp 20 | Ht 70.0 in | Wt 222.0 lb

## 2022-10-30 DIAGNOSIS — I724 Aneurysm of artery of lower extremity: Secondary | ICD-10-CM

## 2022-10-30 MED ORDER — CLOPIDOGREL BISULFATE 75 MG PO TABS: 75.0000 mg | ORAL_TABLET | Freq: Every day | ORAL | 6 refills | Status: AC

## 2022-10-30 NOTE — H&P (View-Only) (Signed)
 Patient ID: Nicholas Mcknight, male   DOB: 05/02/1961, 61 y.o.   MRN: [id number]  Reason for Consult: New Patient (Initial Visit)   Referred by [facility]  Subjective:     HPI:  Nicholas Mcknight is a 61 y.o. male with history of 3 to 4 months claudication in the left lower extremity that really came on suddenly.  No personal family history of aneurysm disease.  He has hypertension and is a lifelong smoker but no previous vascular history.  He does take aspirin daily no other antiplatelet or anticoagulant medications.  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  His work has been limited by severe claudication relieved with walking anything more than about 50 feet he gets pain in the left calf with cramping.  Cramping does resolve after rest but he is quite limited by this at this time.  He denies any rest pain or tissue loss.  Past Medical History:  Diagnosis Date   Hypertension    Family History  Problem Relation Age of Onset   Hypertension Mother    Cirrhosis Father    Hypertension Brother    History reviewed. No pertinent surgical history.  Short Social History:  Social History   Tobacco Use   Smoking status: Every Day    Packs/day: 0.25    Types: Cigarettes   Smokeless tobacco: Never  Substance Use Topics   Alcohol use: Yes    Comment: occ.    No Known Allergies  Current Outpatient Medications  Medication Sig Dispense Refill   amLODipine (NORVASC) 5 MG tablet Take 1 tablet (5 mg total) by mouth daily. 180 tablet 3   atorvastatin (LIPITOR) 40 MG tablet Take 1 tablet (40 mg total) by mouth daily. 90 tablet 3   hydrALAZINE (APRESOLINE) 25 MG tablet Take 1 tablet (25 mg total) by mouth 3 (three) times daily. 90 tablet 6   labetalol (NORMODYNE) 100 MG tablet Take 1 tablet (100 mg total) by mouth 2 (two) times daily. 180 tablet 3   losartan (COZAAR) 100 MG tablet Take 100 mg by mouth every morning.     nebivolol (BYSTOLIC) 5 MG tablet Take  1 tablet by mouth daily.     No current facility-administered medications for this visit.    Review of Systems  Constitutional:  Constitutional negative. HENT: HENT negative.  Eyes: Eyes negative.  Cardiovascular: Positive for claudication.  GI: Gastrointestinal negative.  Musculoskeletal: Positive for leg pain.  Neurological: Neurological negative. Hematologic: Hematologic/lymphatic negative.  Psychiatric: Psychiatric negative.        Objective:  Objective   Vitals:   10/30/22 1146  BP: (!) 162/98  Pulse: 73  Resp: 20  Temp: 98.3 F (36.8 C)  SpO2: 98%  Weight: 222 lb (100.7 kg)  Height: 5' 10" (1.778 m)   Body mass index is 31.85 kg/m.  Physical Exam HENT:     Head: Normocephalic.     Nose: Nose normal.  Eyes:     Pupils: Pupils are equal, round, and reactive to light.  Cardiovascular:     Pulses:          Femoral pulses are 2+ on the right side and 2+ on the left side.      Popliteal pulses are 2+ on the right side and 0 on the left side.       Dorsalis pedis pulses are 2+ on the right side and 0 on the left side.         Posterior tibial pulses are 2+ on the right side and 0 on the left side.  Pulmonary:     Effort: Pulmonary effort is normal.  Abdominal:     General: Abdomen is flat.     Palpations: Abdomen is soft.  Musculoskeletal:        General: Normal range of motion.     Right lower leg: No edema.     Left lower leg: No edema.  Skin:    General: Skin is warm.  Neurological:     General: No focal deficit present.     Mental Status: He is alert.  Psychiatric:        Mood and Affect: Mood normal.        Behavior: Behavior normal.        Thought Content: Thought content normal.        Judgment: Judgment normal.     Data: Duplex IMPRESSION: 1. Thrombosed 2.3 cm popliteal artery aneurysm results in focal occlusive disease of the distal femoropopliteal segment. 2. Otherwise, scattered mild atherosclerotic plaque without significant  stenosis.     Assessment/Plan:    61-year-old male with thrombosed left popliteal artery aneurysm which started about October or November and now with short distance claudication preventing him from work and is severely life-limiting.  We reviewed his imaging today and I discussed with him the options being medical management versus stent versus bypass.  We will plan for stenting on a Monday in the near future and I have sent Plavix to his pharmacy to begin taking now as an adjunct to aspirin.  I discussed the need for urgent smoking cessation.  We discussed the details of the procedure including the risk and benefits particularly of the access site and distal embolization and he demonstrates good understanding.     Yolani Vo Christopher Jalena Vanderlinden MD [facility] [place]   

## 2022-10-30 NOTE — Progress Notes (Signed)
Patient ID: Nicholas Mcknight, male   DOB: 1961-08-15, 62 y.o.   MRN: [id number]  Reason for Consult: New Patient (Initial Visit)   Referred by [facility]  Subjective:     HPI:  Nicholas Mcknight is a 62 y.o. male with history of 3 to 4 months claudication in the left lower extremity that really came on suddenly.  No personal family history of aneurysm disease.  He has hypertension and is a lifelong smoker but no previous vascular history.  He does take aspirin daily no other antiplatelet or anticoagulant medications.  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  His work has been limited by severe claudication relieved with walking anything more than about 50 feet he gets pain in the left calf with cramping.  Cramping does resolve after rest but he is quite limited by this at this time.  He denies any rest pain or tissue loss.  Past Medical History:  Diagnosis Date   Hypertension    Family History  Problem Relation Age of Onset   Hypertension Mother    Cirrhosis Father    Hypertension Brother    History reviewed. No pertinent surgical history.  Short Social History:  Social History   Tobacco Use   Smoking status: Every Day    Packs/day: 0.25    Types: Cigarettes   Smokeless tobacco: Never  Substance Use Topics   Alcohol use: Yes    Comment: occ.    No Known Allergies  Current Outpatient Medications  Medication Sig Dispense Refill   amLODipine (NORVASC) 5 MG tablet Take 1 tablet (5 mg total) by mouth daily. 180 tablet 3   atorvastatin (LIPITOR) 40 MG tablet Take 1 tablet (40 mg total) by mouth daily. 90 tablet 3   hydrALAZINE (APRESOLINE) 25 MG tablet Take 1 tablet (25 mg total) by mouth 3 (three) times daily. 90 tablet 6   labetalol (NORMODYNE) 100 MG tablet Take 1 tablet (100 mg total) by mouth 2 (two) times daily. 180 tablet 3   losartan (COZAAR) 100 MG tablet Take 100 mg by mouth every morning.     nebivolol (BYSTOLIC) 5 MG tablet Take  1 tablet by mouth daily.     No current facility-administered medications for this visit.    Review of Systems  Constitutional:  Constitutional negative. HENT: HENT negative.  Eyes: Eyes negative.  Cardiovascular: Positive for claudication.  GI: Gastrointestinal negative.  Musculoskeletal: Positive for leg pain.  Neurological: Neurological negative. Hematologic: Hematologic/lymphatic negative.  Psychiatric: Psychiatric negative.        Objective:  Objective   Vitals:   10/30/22 1146  BP: (!) 162/98  Pulse: 73  Resp: 20  Temp: 98.3 F (36.8 C)  SpO2: 98%  Weight: 222 lb (100.7 kg)  Height: '5\' 10"'$  (1.778 m)   Body mass index is 31.85 kg/m.  Physical Exam HENT:     Head: Normocephalic.     Nose: Nose normal.  Eyes:     Pupils: Pupils are equal, round, and reactive to light.  Cardiovascular:     Pulses:          Femoral pulses are 2+ on the right side and 2+ on the left side.      Popliteal pulses are 2+ on the right side and 0 on the left side.       Dorsalis pedis pulses are 2+ on the right side and 0 on the left side.  Posterior tibial pulses are 2+ on the right side and 0 on the left side.  Pulmonary:     Effort: Pulmonary effort is normal.  Abdominal:     General: Abdomen is flat.     Palpations: Abdomen is soft.  Musculoskeletal:        General: Normal range of motion.     Right lower leg: No edema.     Left lower leg: No edema.  Skin:    General: Skin is warm.  Neurological:     General: No focal deficit present.     Mental Status: He is alert.  Psychiatric:        Mood and Affect: Mood normal.        Behavior: Behavior normal.        Thought Content: Thought content normal.        Judgment: Judgment normal.     Data: Duplex IMPRESSION: 1. Thrombosed 2.3 cm popliteal artery aneurysm results in focal occlusive disease of the distal femoropopliteal segment. 2. Otherwise, scattered mild atherosclerotic plaque without significant  stenosis.     Assessment/Plan:    62 year old male with thrombosed left popliteal artery aneurysm which started about October or November and now with short distance claudication preventing him from work and is severely life-limiting.  We reviewed his imaging today and I discussed with him the options being medical management versus stent versus bypass.  We will plan for stenting on a Monday in the near future and I have sent Plavix to his pharmacy to begin taking now as an adjunct to aspirin.  I discussed the need for urgent smoking cessation.  We discussed the details of the procedure including the risk and benefits particularly of the access site and distal embolization and he demonstrates good understanding.     Waynetta Sandy MD [facility]

## 2022-11-04 ENCOUNTER — Other Ambulatory Visit: Payer: Self-pay

## 2022-11-04 ENCOUNTER — Ambulatory Visit (HOSPITAL_COMMUNITY)
Admission: RE | Admit: 2022-11-04 | Discharge: 2022-11-04 | Disposition: A | Discharge status: Home / Self Care | Payer: BC Managed Care – PPO | Attending: Vascular Surgery | Admitting: Vascular Surgery

## 2022-11-04 ENCOUNTER — Ambulatory Visit (HOSPITAL_COMMUNITY)
Admission: RE | Disposition: A | Discharge status: Home / Self Care | Payer: Self-pay | Source: Home / Self Care | Attending: Vascular Surgery

## 2022-11-04 ENCOUNTER — Encounter (HOSPITAL_COMMUNITY): Payer: Self-pay | Admitting: Vascular Surgery

## 2022-11-04 DIAGNOSIS — Q2732 Arteriovenous malformation of vessel of lower limb: Secondary | ICD-10-CM | POA: Diagnosis not present

## 2022-11-04 DIAGNOSIS — I70212 Atherosclerosis of native arteries of extremities with intermittent claudication, left leg: Secondary | ICD-10-CM | POA: Insufficient documentation

## 2022-11-04 DIAGNOSIS — F17210 Nicotine dependence, cigarettes, uncomplicated: Secondary | ICD-10-CM | POA: Diagnosis not present

## 2022-11-04 DIAGNOSIS — I743 Embolism and thrombosis of arteries of the lower extremities: Secondary | ICD-10-CM | POA: Diagnosis not present

## 2022-11-04 DIAGNOSIS — Z7982 Long term (current) use of aspirin: Secondary | ICD-10-CM | POA: Diagnosis not present

## 2022-11-04 DIAGNOSIS — I10 Essential (primary) hypertension: Secondary | ICD-10-CM | POA: Insufficient documentation

## 2022-11-04 DIAGNOSIS — I724 Aneurysm of artery of lower extremity: Secondary | ICD-10-CM

## 2022-11-04 HISTORY — PX: ABDOMINAL AORTOGRAM W/LOWER EXTREMITY: CATH118223

## 2022-11-04 HISTORY — PX: PERIPHERAL VASCULAR INTERVENTION: CATH118257

## 2022-11-04 HISTORY — PX: PERIPHERAL VASCULAR ATHERECTOMY: CATH118256

## 2022-11-04 LAB — POCT I-STAT, CHEM 8
BUN: 13 mg/dL (ref 6–20)
Calcium, Ion: 1.15 mmol/L (ref 1.15–1.40)
Chloride: 104 mmol/L (ref 98–111)
Creatinine, Ser: 1.1 mg/dL (ref 0.61–1.24)
Glucose, Bld: 103 mg/dL — ABNORMAL HIGH (ref 70–99)
HCT: 46 % (ref 39.0–52.0)
Hemoglobin: 15.6 g/dL (ref 13.0–17.0)
Potassium: 3.4 mmol/L — ABNORMAL LOW (ref 3.5–5.1)
Sodium: 143 mmol/L (ref 135–145)
TCO2: 27 mmol/L (ref 22–32)

## 2022-11-04 SURGERY — ABDOMINAL AORTOGRAM W/LOWER EXTREMITY
Anesthesia: LOCAL

## 2022-11-04 MED ORDER — SODIUM CHLORIDE 0.9 % IV SOLN: INTRAVENOUS | Status: DC

## 2022-11-04 MED ORDER — MORPHINE SULFATE (PF) 2 MG/ML IV SOLN: 2.0000 mg | INTRAVENOUS | Status: DC | PRN

## 2022-11-04 MED ORDER — HEPARIN SODIUM (PORCINE) 1000 UNIT/ML IJ SOLN
INTRAMUSCULAR | Status: AC
  Filled 2022-11-04: qty 10

## 2022-11-04 MED ORDER — HYDRALAZINE HCL 20 MG/ML IJ SOLN: 5.0000 mg | INTRAMUSCULAR | Status: DC | PRN

## 2022-11-04 MED ORDER — ASPIRIN 81 MG PO CHEW
CHEWABLE_TABLET | ORAL | Status: AC
  Filled 2022-11-04: qty 1

## 2022-11-04 MED ORDER — HEPARIN SODIUM (PORCINE) 1000 UNIT/ML IJ SOLN
INTRAMUSCULAR | Status: DC | PRN
  Administered 2022-11-04: 4000 [IU] via INTRAVENOUS
  Administered 2022-11-04: 10000 [IU] via INTRAVENOUS

## 2022-11-04 MED ORDER — CLOPIDOGREL BISULFATE 300 MG PO TABS
ORAL_TABLET | ORAL | Status: DC | PRN
  Administered 2022-11-04: 75 mg via ORAL

## 2022-11-04 MED ORDER — HYDRALAZINE HCL 20 MG/ML IJ SOLN
INTRAMUSCULAR | Status: AC
  Filled 2022-11-04: qty 1

## 2022-11-04 MED ORDER — HYDRALAZINE HCL 20 MG/ML IJ SOLN
INTRAMUSCULAR | Status: DC | PRN
  Administered 2022-11-04 (×3): 10 mg via INTRAVENOUS

## 2022-11-04 MED ORDER — OXYCODONE HCL 5 MG PO TABS: 5.0000 mg | ORAL_TABLET | ORAL | Status: DC | PRN

## 2022-11-04 MED ORDER — LIDOCAINE HCL (PF) 1 % IJ SOLN
INTRAMUSCULAR | Status: AC
  Filled 2022-11-04: qty 30

## 2022-11-04 MED ORDER — ASPIRIN 81 MG PO TBEC: 81.0000 mg | DELAYED_RELEASE_TABLET | Freq: Every day | ORAL | Status: DC

## 2022-11-04 MED ORDER — HEPARIN (PORCINE) IN NACL 1000-0.9 UT/500ML-% IV SOLN
INTRAVENOUS | Status: DC | PRN
  Administered 2022-11-04 (×2): 500 mL

## 2022-11-04 MED ORDER — CLOPIDOGREL BISULFATE 75 MG PO TABS
ORAL_TABLET | ORAL | Status: AC
  Filled 2022-11-04: qty 1

## 2022-11-04 MED ORDER — ASPIRIN 81 MG PO CHEW
CHEWABLE_TABLET | ORAL | Status: DC | PRN
  Administered 2022-11-04: 81 mg via ORAL

## 2022-11-04 MED ORDER — MIDAZOLAM HCL 5 MG/5ML IJ SOLN
INTRAMUSCULAR | Status: AC
  Filled 2022-11-04: qty 5

## 2022-11-04 MED ORDER — IODIXANOL 320 MG/ML IV SOLN
INTRAVENOUS | Status: DC | PRN
  Administered 2022-11-04: 70 mL via INTRA_ARTERIAL

## 2022-11-04 MED ORDER — ONDANSETRON HCL 4 MG/2ML IJ SOLN: 4.0000 mg | Freq: Four times a day (QID) | INTRAMUSCULAR | Status: DC | PRN

## 2022-11-04 MED ORDER — MIDAZOLAM HCL 2 MG/2ML IJ SOLN
INTRAMUSCULAR | Status: DC | PRN
  Administered 2022-11-04: 1 mg via INTRAVENOUS

## 2022-11-04 MED ORDER — LIDOCAINE HCL (PF) 1 % IJ SOLN
INTRAMUSCULAR | Status: DC | PRN
  Administered 2022-11-04: 10 mL

## 2022-11-04 MED ORDER — SODIUM CHLORIDE 0.9% FLUSH: 3.0000 mL | INTRAVENOUS | Status: DC | PRN

## 2022-11-04 MED ORDER — LABETALOL HCL 5 MG/ML IV SOLN: 10.0000 mg | INTRAVENOUS | Status: DC | PRN

## 2022-11-04 MED ORDER — SODIUM CHLORIDE 0.9 % IV SOLN: 250.0000 mL | INTRAVENOUS | Status: DC | PRN

## 2022-11-04 MED ORDER — CLOPIDOGREL BISULFATE 75 MG PO TABS: 75.0000 mg | ORAL_TABLET | Freq: Every day | ORAL | Status: DC

## 2022-11-04 MED ORDER — ACETAMINOPHEN 325 MG PO TABS: 650.0000 mg | ORAL_TABLET | ORAL | Status: DC | PRN

## 2022-11-04 MED ORDER — SODIUM CHLORIDE 0.9% FLUSH: 3.0000 mL | Freq: Two times a day (BID) | INTRAVENOUS | Status: DC

## 2022-11-04 MED ORDER — FENTANYL CITRATE (PF) 100 MCG/2ML IJ SOLN
INTRAMUSCULAR | Status: DC | PRN
  Administered 2022-11-04: 25 ug via INTRAVENOUS
  Administered 2022-11-04: 50 ug via INTRAVENOUS

## 2022-11-04 MED ORDER — FENTANYL CITRATE (PF) 100 MCG/2ML IJ SOLN
INTRAMUSCULAR | Status: AC
  Filled 2022-11-04: qty 2

## 2022-11-04 SURGICAL SUPPLY — 30 items
BALL STERLING OTW 2.5X150X150 (BALLOONS) ×2
BALLN STERLING OTW 2.5X150X150 (BALLOONS) ×2
BALLN STERLING OTW 7X60X135 (BALLOONS) ×2
BALLN STERLING OTW 8X40X135 (BALLOONS) ×2
BALLOON STERLING OTW 7X60X135 (BALLOONS) IMPLANT
BALLOON STERLING OTW 8X40X135 (BALLOONS) IMPLANT
BALLOON STRLNG OTW 2.5X150X150 (BALLOONS) IMPLANT
CATH AURYON ATHERECTOMY 0.9 (CATHETERS) IMPLANT
CATH OMNI FLUSH 5F 65CM (CATHETERS) IMPLANT
CATH QUICKCROSS .035X135CM (MICROCATHETER) IMPLANT
CATH QUICKCROSS ANG SELECT (CATHETERS) IMPLANT
CLOSURE MYNX CONTROL 6F/7F (Vascular Products) IMPLANT
DEVICE TORQUE .025-.038 (MISCELLANEOUS) IMPLANT
GLIDEWIRE ADV .035X260CM (WIRE) IMPLANT
KIT ENCORE 26 ADVANTAGE (KITS) IMPLANT
KIT MICROPUNCTURE NIT STIFF (SHEATH) IMPLANT
KIT PV (KITS) ×2 IMPLANT
SHEATH CATAPULT 7FR 60 (SHEATH) IMPLANT
SHEATH PINNACLE 5F 10CM (SHEATH) IMPLANT
SHEATH PINNACLE 7F 10CM (SHEATH) IMPLANT
SHEATH PROBE COVER 6X72 (BAG) IMPLANT
STENT VIABAHN 8X7.5X120 (Permanent Stent) IMPLANT
STOPCOCK MORSE 400PSI 3WAY (MISCELLANEOUS) IMPLANT
SYR MEDRAD MARK 7 150ML (SYRINGE) ×2 IMPLANT
TRANSDUCER W/STOPCOCK (MISCELLANEOUS) ×2 IMPLANT
TRAY PV CATH (CUSTOM PROCEDURE TRAY) ×2 IMPLANT
TUBING CIL FLEX 10 FLL-RA (TUBING) IMPLANT
WIRE BENTSON .035X145CM (WIRE) IMPLANT
WIRE G V18X300CM (WIRE) IMPLANT
WIRE SPARTACORE .014X300CM (WIRE) IMPLANT

## 2022-11-04 NOTE — Interval H&P Note (Signed)
History and Physical Interval Note:  11/04/2022 7:24 AM  Nicholas Mcknight  has presented today for surgery, with the diagnosis of pop artery aneurysm.  The various methods of treatment have been discussed with the patient and family. After consideration of risks, benefits and other options for treatment, the patient has consented to  Procedure(s): ABDOMINAL AORTOGRAM W/LOWER EXTREMITY (N/A) as a surgical intervention.  The patient's history has been reviewed, patient examined, no change in status, stable for surgery.  I have reviewed the patient's chart and labs.  Questions were answered to the patient's satisfaction.     Servando Snare

## 2022-11-04 NOTE — Op Note (Signed)
Patient name: Nicholas Mcknight MRN: [id number] DOB: 16-Mar-1961 Sex: male  11/04/2022 Pre-operative Diagnosis: Thrombosed left popliteal artery aneurysm Post-operative diagnosis:  Same Surgeon:  Erlene Quan C. Donzetta Matters, MD Procedure Performed: 1.  Ultrasound guided cannulation right common femoral artery 2.  Selection of left common femoral artery and left lower extremity angiography 3.  Stent of left popliteal artery with 8 x 75 mm Viabahn postdilated with 8 mm balloon 4.  Laser arthrectomy left anterior tibial artery using 0.9 Auryon and 2.5 mm plain balloon angioplasty 5.  Mynx device closure right common femoral artery 6.  Moderate sedation with fentanyl and Versed for 84 minutes   Indications: 62 year old male with history of acute onset claudication approximately 4 months ago found to have occluded left popliteal artery aneurysm.  He now has very short distance claudication and is indicated for angiography of the left lower extremity with possible invention.  Findings: Aorta and right lower extremity were not evaluated given recent CT angio with mild increase in patient's creatinine.  Left lower extremity there is some areas of disease throughout the common femoral and SFA not more than 20% stenosis.  Popliteal artery frankly occludes above the knee with a very large collateral reconstitutes the below the knee and there are initial 3 vessels proximal posterior tibial artery occludes above the ankle, the peroneal artery occludes in the mid calf and the anterior tibial artery occludes distally in the distal one third and reconstitutes the dorsalis pedis.  After stenting of the occluded popliteal artery we reduced the stenosis to 0% and the aneurysm appears to be excluded.  We then elected to treat the anterior tibial artery distally that this would laser arthrectomy followed by balloon angioplasty and at completion there was no residual stenosis although there did appear to be some spasm in the vessel  there was brisk flow into the foot and a very strong dorsalis pedis signal by Doppler.    Procedure:  The patient was identified in the holding area and taken to room 2.  The patient was then placed supine on the table and prepped and draped in the usual sterile fashion.  A time out was called.  Ultrasound was used to evaluate the right common femoral artery which is patent and compressible and free of disease.  The area was anesthetized with 1% lidocaine cannulated by functional followed by wire and sheath.  And images saved to the permanent record.  Concomitantly ministered fentanyl and Versed as moderate sedation and his vitals were monitored by bedside nursing throughout the case.  We placed a Bentson wire followed by 5 [place] sheath and then crossed the bifurcation with Omni catheter Glidewire advantage and perform left lower extremity angiography from the left common femoral artery down.  We then placed a long 7 French sheath in the left SFA and patient was fully heparinized.  We then Glidewire advantage and quick cross catheter across intraluminally into the below-knee popliteal artery across the occluded popliteal aneurysm and confirmed intraluminal access.  We then primarily stented with an 8 x 75 mm Viabahn and postdilated first with 7 and 8 mm balloons.  We then turned our attention distally using a CXI and V18 we were able to cross the occluded anterior tibial artery confirmed intraluminal access and exchanged for an 014 wire.  We then performed laser arthrectomy with 0.9 Auryon device and then primarily balloon angioplasty with 2.5 mm balloon.  Completion demonstrated brisk flow in the foot and there was a very strong signal.  Satisfied with distribution for a short 7 [place] sheath over the advantage wire and then deployed a minx device.  Patient tolerated procedure without any complication.  Contrast: 70cc   Rise Traeger C. Donzetta Matters, MD [facility] Office:  [phone] Pager: [phone]

## 2022-11-05 MED FILL — Midazolam HCl Inj 5 MG/5ML (Base Equivalent): INTRAMUSCULAR | Qty: 1 | Status: AC

## 2023-01-28 ENCOUNTER — Telehealth: Payer: Self-pay

## 2023-01-28 DIAGNOSIS — I724 Aneurysm of artery of lower extremity: Secondary | ICD-10-CM

## 2023-01-28 NOTE — Telephone Encounter (Signed)
Caller: Patient  Concern: Stent placed over 2 mos ago, now ankle and leg are swollen, no pain, but tightness  Pt denies any discoloration, coldness, or open wounds/sores  Location: left leg  Description:  x 2 wks  Quality: tight (pulling)  Treatments:  Proper elevation  Procedure: Aneurysm Resection/Repair Popliteal aneurysm stent   Resolution: Appointment scheduled for 1st available since no post-op appt done to date  Next Appt: Appointment scheduled for 01/29/23 for [place], ABI, & Dr. Randie Heinz

## 2023-01-29 ENCOUNTER — Ambulatory Visit (INDEPENDENT_AMBULATORY_CARE_PROVIDER_SITE_OTHER): Payer: BC Managed Care – PPO | Admitting: Vascular Surgery

## 2023-01-29 ENCOUNTER — Encounter: Payer: Self-pay | Admitting: Vascular Surgery

## 2023-01-29 ENCOUNTER — Ambulatory Visit (HOSPITAL_COMMUNITY)
Admission: RE | Admit: 2023-01-29 | Discharge: 2023-01-29 | Disposition: A | Discharge status: Home / Self Care | Payer: BC Managed Care – PPO | Source: Ambulatory Visit | Attending: Vascular Surgery | Admitting: Vascular Surgery

## 2023-01-29 ENCOUNTER — Ambulatory Visit (INDEPENDENT_AMBULATORY_CARE_PROVIDER_SITE_OTHER)
Admission: RE | Admit: 2023-01-29 | Discharge: 2023-01-29 | Disposition: A | Discharge status: Home / Self Care | Payer: BC Managed Care – PPO | Source: Ambulatory Visit | Attending: Vascular Surgery | Admitting: Vascular Surgery

## 2023-01-29 VITALS — BP 170/115 | HR 78 | Temp 98.0°F | Resp 20 | Ht 70.0 in | Wt 227.0 lb

## 2023-01-29 DIAGNOSIS — I724 Aneurysm of artery of lower extremity: Secondary | ICD-10-CM

## 2023-01-29 DIAGNOSIS — M7989 Other specified soft tissue disorders: Secondary | ICD-10-CM

## 2023-01-29 LAB — VAS US ABI WITH/WO TBI
Left ABI: 0.84
Right ABI: 1.08

## 2023-01-29 MED ORDER — ATORVASTATIN CALCIUM 40 MG PO TABS: 40.0000 mg | ORAL_TABLET | Freq: Every day | ORAL | 9 refills | Status: AC

## 2023-01-29 NOTE — Progress Notes (Signed)
Patient ID: Nicholas Mcknight, male   DOB: 08/02/1961, 62 y.o.   MRN: [id number]  Reason for Consult: Follow-up   Referred by Oneal Grout, FNP  Subjective:     HPI:  Nicholas Mcknight is a 62 y.o. male status post repair of left popliteal artery aneurysm with stent graft and also treatment of left anterior tibial artery occlusive disease.  Since the procedure his short distance claudication and pain in the left lower extremity have resolved and he now complains only of swelling of the left lower extremity.  He has no symptoms of the right lower extremity.  Past Medical History:  Diagnosis Date   Hypertension    Family History  Problem Relation Age of Onset   Hypertension Mother    Cirrhosis Father    Hypertension Brother    Past Surgical History:  Procedure Laterality Date   ABDOMINAL AORTOGRAM W/LOWER EXTREMITY N/A 11/04/2022   Procedure: ABDOMINAL AORTOGRAM W/LOWER EXTREMITY;  Surgeon: Maeola Harman, MD;  Location: [facility] INVASIVE CV LAB;  Service: Cardiovascular;  Laterality: N/A;   PERIPHERAL VASCULAR ATHERECTOMY Left 11/04/2022   Procedure: PERIPHERAL VASCULAR ATHERECTOMY;  Surgeon: Maeola Harman, MD;  Location: [facility] INVASIVE CV LAB;  Service: Cardiovascular;  Laterality: Left;  L AT   PERIPHERAL VASCULAR INTERVENTION Left 11/04/2022   Procedure: PERIPHERAL VASCULAR INTERVENTION;  Surgeon: Maeola Harman, MD;  Location: [facility] INVASIVE CV LAB;  Service: Cardiovascular;  Laterality: Left;  L pop    Short Social History:  Social History   Tobacco Use   Smoking status: Every Day    Packs/day: .25    Types: Cigarettes   Smokeless tobacco: Never  Substance Use Topics   Alcohol use: Yes    Comment: occ.    No Known Allergies  Current Outpatient Medications  Medication Sig Dispense Refill   amLODipine (NORVASC) 5 MG tablet Take 5 mg by mouth daily.     atorvastatin (LIPITOR) 40 MG tablet Take 40 mg by mouth daily.     clopidogrel (PLAVIX) 75 MG  tablet Take 1 tablet (75 mg total) by mouth daily. 30 tablet 6   hydrALAZINE (APRESOLINE) 25 MG tablet Take 25 mg by mouth 3 (three) times daily.     losartan (COZAAR) 100 MG tablet Take 100 mg by mouth every morning.     naproxen sodium (ALEVE) 220 MG tablet Take 440 mg by mouth daily as needed (pain).     nebivolol (BYSTOLIC) 5 MG tablet Take 5 mg by mouth daily.     No current facility-administered medications for this visit.    Review of Systems  Constitutional:  Constitutional negative. HENT: HENT negative.  Eyes: Eyes negative.  Respiratory: Respiratory negative.  Cardiovascular: Cardiovascular negative.  GI: Gastrointestinal negative.  Musculoskeletal: Musculoskeletal negative.  Skin: Skin negative.  Neurological: Neurological negative. Hematologic: Hematologic/lymphatic negative.  Psychiatric: Psychiatric negative.        Objective:  Objective   Vitals:   01/29/23 1126  BP: (!) 170/115  Pulse: 78  Resp: 20  Temp: 98 F (36.7 C)  SpO2: 97%     Physical Exam HENT:     Head: Normocephalic.  Eyes:     Pupils: Pupils are equal, round, and reactive to light.  Cardiovascular:     Rate and Rhythm: Normal rate.     Pulses:          Dorsalis pedis pulses are 0 on the right side and 2+ on the left side.  Posterior tibial pulses are 0 on the right side.  Pulmonary:     Effort: Pulmonary effort is normal.  Abdominal:     General: Abdomen is flat.  Musculoskeletal:     Right lower leg: No edema.     Left lower leg: Edema present.  Skin:    General: Skin is warm.     Capillary Refill: Capillary refill takes less than 2 seconds.  Neurological:     General: No focal deficit present.     Mental Status: He is alert.  Psychiatric:        Mood and Affect: Mood normal.        Thought Content: Thought content normal.        Judgment: Judgment normal.     Data: ABI Findings:  +---------+------------------+-----+---------+--------+  Right   Rt Pressure  (mmHg)IndexWaveform Comment   +---------+------------------+-----+---------+--------+  Brachial 173                                       +---------+------------------+-----+---------+--------+  PTA     162               0.89 biphasic           +---------+------------------+-----+---------+--------+  DP      197               1.08 triphasic          +---------+------------------+-----+---------+--------+  Great Toe129               0.70 Normal             +---------+------------------+-----+---------+--------+   +---------+------------------+-----+---------+-------+  Left    Lt Pressure (mmHg)IndexWaveform Comment  +---------+------------------+-----+---------+-------+  Brachial 183                                      +---------+------------------+-----+---------+-------+  PTA     153               0.84 triphasic         +---------+------------------+-----+---------+-------+  DP      149               0.81 triphasic         +---------+------------------+-----+---------+-------+  Great Toe132               0.72 Normal            +---------+------------------+-----+---------+-------+   +-------+-----------+-----------+------------+------------+  ABI/TBIToday's ABIToday's TBIPrevious ABIPrevious TBI  +-------+-----------+-----------+------------+------------+  Right 1.08       0.70                                 +-------+-----------+-----------+------------+------------+  Left  0.84       0.72                                 +-------+-----------+-----------+------------+------------+         Summary:  Right: Resting right ankle-brachial index is within normal range. The  right toe-brachial index is normal.   Left: Resting left ankle-brachial index indicates mild left lower  extremity arterial disease. The left toe-brachial index is normal.    +-----------+--------+-----+--------+---------+--------+   LEFT      PSV  cm/sRatioStenosisWaveform Comments  +-----------+--------+-----+--------+---------+--------+  CFA Prox   120                  triphasic          +-----------+--------+-----+--------+---------+--------+  DFA       56                   biphasic           +-----------+--------+-----+--------+---------+--------+  SFA Prox   83                   triphasic          +-----------+--------+-----+--------+---------+--------+  SFA Mid    118                  triphasic          +-----------+--------+-----+--------+---------+--------+  SFA Distal 70                   triphasic          +-----------+--------+-----+--------+---------+--------+  ATA Distal 53                   triphasic          +-----------+--------+-----+--------+---------+--------+  PTA Distal 50                   triphasic          +-----------+--------+-----+--------+---------+--------+  PERO Distal58                   triphasic          +-----------+--------+-----+--------+---------+--------+     Left Stent(s):  +---------------+--------+--------+---------+--------+  Popliteal     PSV cm/sStenosisWaveform Comments  +---------------+--------+--------+---------+--------+  Prox to Stent  43              triphasic          +---------------+--------+--------+---------+--------+  Proximal Stent 51              triphasic          +---------------+--------+--------+---------+--------+  Mid Stent      57              triphasic          +---------------+--------+--------+---------+--------+  Distal Stent   56              triphasic          +---------------+--------+--------+---------+--------+  Distal to Stent64              triphasic          +---------------+--------+--------+---------+--------+    Summary:  Left: Patent stent with no evidence of stenosis in the popliteal artery  artery.       Assessment/Plan:      62 year old male status post stent graft repair of the left popliteal artery aneurysm now with swelling of the left lower extremity.  I discussed the etiology of swelling likely being revascularization possibly due to compressive nature of the popliteal artery aneurysm although was noted to be small by preoperative CT scan greatest diameter 2.6 cm.  No aneurysm on the right side.  We will fit for knee-high compression stockings today have him follow-up in 6 months with repeat left lower extremity arterial duplex and ABIs.    Maeola Harman MD [facility]

## 2023-01-30 ENCOUNTER — Other Ambulatory Visit: Payer: Self-pay

## 2023-01-30 DIAGNOSIS — I724 Aneurysm of artery of lower extremity: Secondary | ICD-10-CM

## 2023-06-30 ENCOUNTER — Emergency Department (HOSPITAL_COMMUNITY): Payer: BC Managed Care – PPO

## 2023-06-30 ENCOUNTER — Encounter (HOSPITAL_COMMUNITY): Payer: Self-pay

## 2023-06-30 ENCOUNTER — Emergency Department (HOSPITAL_COMMUNITY)
Admission: EM | Admit: 2023-06-30 | Discharge: 2023-06-30 | Disposition: A | Discharge status: Home / Self Care | Payer: BC Managed Care – PPO | Attending: Emergency Medicine | Admitting: Emergency Medicine

## 2023-06-30 ENCOUNTER — Other Ambulatory Visit: Payer: Self-pay

## 2023-06-30 DIAGNOSIS — Z79899 Other long term (current) drug therapy: Secondary | ICD-10-CM | POA: Insufficient documentation

## 2023-06-30 DIAGNOSIS — I10 Essential (primary) hypertension: Secondary | ICD-10-CM | POA: Diagnosis not present

## 2023-06-30 DIAGNOSIS — Z7902 Long term (current) use of antithrombotics/antiplatelets: Secondary | ICD-10-CM | POA: Diagnosis not present

## 2023-06-30 DIAGNOSIS — R519 Headache, unspecified: Secondary | ICD-10-CM | POA: Diagnosis present

## 2023-06-30 LAB — CBC WITH DIFFERENTIAL/PLATELET
Abs Immature Granulocytes: 0.04 10*3/uL (ref 0.00–0.07)
Basophils Absolute: 0.1 10*3/uL (ref 0.0–0.1)
Basophils Relative: 1 %
Eosinophils Absolute: 0.2 10*3/uL (ref 0.0–0.5)
Eosinophils Relative: 2 %
HCT: 46.9 % (ref 39.0–52.0)
Hemoglobin: 15.2 g/dL (ref 13.0–17.0)
Immature Granulocytes: 0 %
Lymphocytes Relative: 55 %
Lymphs Abs: 6.7 10*3/uL — ABNORMAL HIGH (ref 0.7–4.0)
MCH: 30 pg (ref 26.0–34.0)
MCHC: 32.4 g/dL (ref 30.0–36.0)
MCV: 92.7 fL (ref 80.0–100.0)
Monocytes Absolute: 0.9 10*3/uL (ref 0.1–1.0)
Monocytes Relative: 8 %
Neutro Abs: 4.1 10*3/uL (ref 1.7–7.7)
Neutrophils Relative %: 34 %
Platelets: 303 10*3/uL (ref 150–400)
RBC: 5.06 MIL/uL (ref 4.22–5.81)
RDW: 14.7 % (ref 11.5–15.5)
WBC: 12 10*3/uL — ABNORMAL HIGH (ref 4.0–10.5)
nRBC: 0 % (ref 0.0–0.2)

## 2023-06-30 LAB — BASIC METABOLIC PANEL
Anion gap: 10 (ref 5–15)
BUN: 13 mg/dL (ref 6–20)
CO2: 26 mmol/L (ref 22–32)
Calcium: 8.9 mg/dL (ref 8.9–10.3)
Chloride: 102 mmol/L (ref 98–111)
Creatinine, Ser: 1.07 mg/dL (ref 0.61–1.24)
GFR, Estimated: >60 mL/min (ref >60)
Glucose, Bld: 94 mg/dL (ref 70–99)
Potassium: 3.5 mmol/L (ref 3.5–5.1)
Sodium: 138 mmol/L (ref 135–145)

## 2023-06-30 MED ORDER — ALBUTEROL SULFATE (2.5 MG/3ML) 0.083% IN NEBU: 3.0000 mL | INHALATION_SOLUTION | RESPIRATORY_TRACT | Status: DC | PRN

## 2023-06-30 MED ORDER — OXYCODONE-ACETAMINOPHEN 5-325 MG PO TABS
1.0000 | ORAL_TABLET | Freq: Once | ORAL | Status: AC
  Administered 2023-06-30: 1 via ORAL
  Filled 2023-06-30: qty 1

## 2023-06-30 NOTE — Discharge Instructions (Signed)
Take your blood pressure medicine as prescribed and follow-up with your primary care doctor next week

## 2023-06-30 NOTE — ED Notes (Signed)
Pt ambulated to room with steady gait.

## 2023-06-30 NOTE — ED Provider Triage Note (Signed)
Emergency Medicine Provider Triage Evaluation Note  Nicholas Mcknight , a 62 y.o. male  was evaluated in triage.  Pt complains of pressure, has been off his medications for several weeks, refill this morning and took it.  States he been having some headaches.  Reports shortness of breath at triage but not actively short of breath.  Review of Systems  Positive: Blood pressure, mild headache, shortness of breath Negative: Chest pain  Physical Exam  BP (!) 189/115 (BP Location: Right Arm)   Pulse 81   Temp 98.7 F (37.1 C) (Oral)   Resp 18   Ht 5\' 10"  (1.778 m)   Wt 103 kg   SpO2 98%   BMI 32.57 kg/m  Gen:   Awake, no distress   Resp:  Normal effort  MSK:   Moves extremities without difficulty  Other:    Medical Decision Making  Medically screening exam initiated at 2:56 PM.  Appropriate orders placed.  Nicholas Mcknight was informed that the remainder of the evaluation will be completed by another provider, this initial triage assessment does not replace that evaluation, and the importance of remaining in the ED until their evaluation is complete.     Ma Rings, [place] 06/30/23 1458

## 2023-06-30 NOTE — ED Triage Notes (Signed)
Pt to er, pt states that he is here for some shortness of breath, states that it started about two weeks ago, states that he has high blood pressure, states that he hasn't been taking his medications.  Pt states that he also has some pressure when he walks around

## 2023-06-30 NOTE — ED Provider Notes (Signed)
[place] EMERGENCY DEPARTMENT AT [facility] Provider Note   CSN: [id number] Arrival date & time: 06/30/23  1318     History {Add pertinent medical, surgical, social history, OB history to HPI:1} Chief Complaint  Patient presents with   Shortness of Breath    JOESEPH Mcknight is a 62 y.o. male.  Patient has a history of hypertension.  Patient has not been taking his for blood pressure medicines for 2 weeks but did take them today and has a mild headache   Shortness of Breath      Home Medications Prior to Admission medications   Medication Sig Start Date End Date Taking? Authorizing Provider  amLODipine (NORVASC) 5 MG tablet Take 5 mg by mouth daily.    [provider]  atorvastatin (LIPITOR) 40 MG tablet Take 40 mg by mouth daily.    [provider]  atorvastatin (LIPITOR) 40 MG tablet Take 1 tablet (40 mg total) by mouth daily. 01/29/23   Maeola Harman, MD  clopidogrel (PLAVIX) 75 MG tablet Take 1 tablet (75 mg total) by mouth daily. 10/30/22   Maeola Harman, MD  hydrALAZINE (APRESOLINE) 25 MG tablet Take 25 mg by mouth 3 (three) times daily.    [provider]  losartan (COZAAR) 100 MG tablet Take 100 mg by mouth every morning.    [provider]  naproxen sodium (ALEVE) 220 MG tablet Take 440 mg by mouth daily as needed (pain).    [provider]  nebivolol (BYSTOLIC) 5 MG tablet Take 5 mg by mouth daily.    [provider]      Allergies    Patient has no known allergies.    Review of Systems   Review of Systems  Respiratory:  Positive for shortness of breath.     Physical Exam Updated Vital Signs BP (!) 183/106   Pulse 77   Temp 98.7 F (37.1 C) (Oral)   Resp 20   Ht 5\' 10"  (1.778 m)   Wt 103 kg   SpO2 99%   BMI 32.57 kg/m  Physical Exam  ED Results / Procedures / Treatments   Labs (all labs ordered are listed, but only abnormal results are displayed) Labs Reviewed   CBC WITH DIFFERENTIAL/PLATELET - Abnormal; Notable for the following components:      Result Value   WBC 12.0 (*)    Lymphs Abs 6.7 (*)    All other components within normal limits  BASIC METABOLIC PANEL    EKG EKG Interpretation Date/Time:  Monday June 30 2023 13:36:18 EST Ventricular Rate:  87 PR Interval:  160 QRS Duration:  82 QT Interval:  392 QTC Calculation: 471 R Axis:   99  Text Interpretation: Normal sinus rhythm Non-specific ST-t changes Confirmed by Cathren Laine ([id number]) on 06/30/2023 1:40:49 PM  Radiology DG Chest 2 View  Result Date: 06/30/2023 CLINICAL DATA:  Shortness of breath. EXAM: CHEST - 2 VIEW COMPARISON:  None Available. FINDINGS: Bilateral lung fields are clear. Elevated right hemidiaphragm noted. Bilateral costophrenic angles are clear. Normal cardio-mediastinal silhouette. No acute osseous abnormalities. The soft tissues are within normal limits. IMPRESSION: *No active cardiopulmonary disease. Electronically Signed   By: Jules Schick M.D.   On: 06/30/2023 16:02    Procedures Procedures  {Document cardiac monitor, telemetry assessment procedure when appropriate:1}  Medications Ordered in ED Medications  albuterol (PROVENTIL) (2.5 MG/3ML) 0.083% nebulizer solution 3 mL (has no administration in time range)  oxyCODONE-acetaminophen (PERCOCET/ROXICET) 5-325 MG per tablet  1 tablet (1 tablet Oral Given 06/30/23 1553)    ED Course/ Medical Decision Making/ A&P   {   Click here for ABCD2, HEART and other calculatorsREFRESH Note before signing :1}                              Medical Decision Making Amount and/or Complexity of Data Reviewed Radiology: ordered.  Risk Prescription drug management.   Patient with poorly controlled blood pressure.  He will start taking his medicine as prescribed and follow-up with his PCP  {Document critical care time when appropriate:1} {Document review of labs and clinical decision tools ie heart score,  Chads2Vasc2 etc:1}  {Document your independent review of radiology images, and any outside records:1} {Document your discussion with family members, caretakers, and with consultants:1} {Document social determinants of health affecting pt's care:1} {Document your decision making why or why not admission, treatments were needed:1} Final Clinical Impression(s) / ED Diagnoses Final diagnoses:  Primary hypertension    Rx / DC Orders ED Discharge Orders     None

## 2023-08-06 ENCOUNTER — Ambulatory Visit (HOSPITAL_COMMUNITY): Payer: BC Managed Care – PPO

## 2023-08-06 ENCOUNTER — Ambulatory Visit: Payer: BC Managed Care – PPO | Admitting: Vascular Surgery

## 2024-01-25 ENCOUNTER — Emergency Department (HOSPITAL_COMMUNITY)

## 2024-01-25 ENCOUNTER — Encounter (HOSPITAL_COMMUNITY): Payer: Self-pay

## 2024-01-25 ENCOUNTER — Other Ambulatory Visit: Payer: Self-pay

## 2024-01-25 ENCOUNTER — Emergency Department (HOSPITAL_COMMUNITY)
Admission: EM | Admit: 2024-01-25 | Discharge: 2024-01-25 | Disposition: A | Discharge status: Home / Self Care | Attending: Emergency Medicine | Admitting: Emergency Medicine

## 2024-01-25 DIAGNOSIS — M545 Low back pain, unspecified: Secondary | ICD-10-CM | POA: Diagnosis present

## 2024-01-25 DIAGNOSIS — Z7901 Long term (current) use of anticoagulants: Secondary | ICD-10-CM | POA: Insufficient documentation

## 2024-01-25 DIAGNOSIS — Z79899 Other long term (current) drug therapy: Secondary | ICD-10-CM | POA: Insufficient documentation

## 2024-01-25 DIAGNOSIS — I159 Secondary hypertension, unspecified: Secondary | ICD-10-CM

## 2024-01-25 DIAGNOSIS — D72829 Elevated white blood cell count, unspecified: Secondary | ICD-10-CM | POA: Insufficient documentation

## 2024-01-25 LAB — CBC WITH DIFFERENTIAL/PLATELET
Abs Immature Granulocytes: 0.04 10*3/uL (ref 0.00–0.07)
Basophils Absolute: 0.1 10*3/uL (ref 0.0–0.1)
Basophils Relative: 1 %
Eosinophils Absolute: 0 10*3/uL (ref 0.0–0.5)
Eosinophils Relative: 0 %
HCT: 52.7 % — ABNORMAL HIGH (ref 39.0–52.0)
Hemoglobin: 18.3 g/dL — ABNORMAL HIGH (ref 13.0–17.0)
Immature Granulocytes: 0 %
Lymphocytes Relative: 35 %
Lymphs Abs: 3.8 10*3/uL (ref 0.7–4.0)
MCH: 31.2 pg (ref 26.0–34.0)
MCHC: 34.7 g/dL (ref 30.0–36.0)
MCV: 89.8 fL (ref 80.0–100.0)
Monocytes Absolute: 0.7 10*3/uL (ref 0.1–1.0)
Monocytes Relative: 7 %
Neutro Abs: 6.1 10*3/uL (ref 1.7–7.7)
Neutrophils Relative %: 57 %
Platelets: 279 10*3/uL (ref 150–400)
RBC: 5.87 MIL/uL — ABNORMAL HIGH (ref 4.22–5.81)
RDW: 14.2 % (ref 11.5–15.5)
WBC: 10.8 10*3/uL — ABNORMAL HIGH (ref 4.0–10.5)
nRBC: 0 % (ref 0.0–0.2)

## 2024-01-25 LAB — URINALYSIS, ROUTINE W REFLEX MICROSCOPIC
Bacteria, UA: NONE SEEN
Bilirubin Urine: NEGATIVE
Glucose, UA: NEGATIVE mg/dL
Hgb urine dipstick: NEGATIVE
Ketones, ur: NEGATIVE mg/dL
Leukocytes,Ua: NEGATIVE
Nitrite: NEGATIVE
Protein, ur: 30 mg/dL — AB
Specific Gravity, Urine: 1.018 (ref 1.005–1.030)
pH: 6 (ref 5.0–8.0)

## 2024-01-25 LAB — COMPREHENSIVE METABOLIC PANEL WITH GFR
ALT: 27 U/L (ref 0–44)
AST: 27 U/L (ref 15–41)
Albumin: 4.9 g/dL (ref 3.5–5.0)
Alkaline Phosphatase: 131 U/L — ABNORMAL HIGH (ref 38–126)
Anion gap: 12 (ref 5–15)
BUN: 15 mg/dL (ref 8–23)
CO2: 25 mmol/L (ref 22–32)
Calcium: 9.9 mg/dL (ref 8.9–10.3)
Chloride: 102 mmol/L (ref 98–111)
Creatinine, Ser: 1.04 mg/dL (ref 0.61–1.24)
GFR, Estimated: >60 mL/min (ref >60)
Glucose, Bld: 122 mg/dL — ABNORMAL HIGH (ref 70–99)
Potassium: 3.3 mmol/L — ABNORMAL LOW (ref 3.5–5.1)
Sodium: 139 mmol/L (ref 135–145)
Total Bilirubin: 0.6 mg/dL (ref 0.0–1.2)
Total Protein: 9.3 g/dL — ABNORMAL HIGH (ref 6.5–8.1)

## 2024-01-25 MED ORDER — HYDROCODONE-ACETAMINOPHEN 5-325 MG PO TABS: 1.0000 | ORAL_TABLET | Freq: Four times a day (QID) | ORAL | 0 refills | Status: AC | PRN

## 2024-01-25 MED ORDER — IOHEXOL 350 MG/ML SOLN
100.0000 mL | Freq: Once | INTRAVENOUS | Status: AC | PRN
  Administered 2024-01-25: 100 mL via INTRAVENOUS

## 2024-01-25 MED ORDER — OXYCODONE-ACETAMINOPHEN 5-325 MG PO TABS
1.0000 | ORAL_TABLET | Freq: Once | ORAL | Status: AC
  Administered 2024-01-25: 1 via ORAL
  Filled 2024-01-25: qty 1

## 2024-01-25 MED ORDER — LOSARTAN POTASSIUM 25 MG PO TABS
100.0000 mg | ORAL_TABLET | Freq: Once | ORAL | Status: AC
  Administered 2024-01-25: 100 mg via ORAL
  Filled 2024-01-25: qty 4

## 2024-01-25 MED ORDER — PREDNISONE 10 MG PO TABS: 40.0000 mg | ORAL_TABLET | Freq: Every day | ORAL | 0 refills | Status: AC

## 2024-01-25 MED ORDER — DEXAMETHASONE SODIUM PHOSPHATE 10 MG/ML IJ SOLN
10.0000 mg | Freq: Once | INTRAMUSCULAR | Status: AC
  Administered 2024-01-25: 10 mg via INTRAVENOUS
  Filled 2024-01-25: qty 1

## 2024-01-25 NOTE — ED Notes (Signed)
 Pt states he does take BP meds but did not take them today.

## 2024-01-25 NOTE — ED Triage Notes (Signed)
 Pt c/o lower back pain on the right side that started last week, states it is coming down his leg now. States he is not getting any sleep with the pain. Pt has been taking a muscle relaxer w/o any relief.

## 2024-01-25 NOTE — ED Notes (Signed)
 Pt/family received d/c paperwork at this time. After going over the paperwork any questions, comments, or concerns were answered to the best of this nurse's knowledge. The pt/family verbally acknowledged the teachings/instructions.

## 2024-01-25 NOTE — ED Provider Notes (Signed)
 [place] EMERGENCY DEPARTMENT AT [facility] Provider Note   CSN: [id number] Arrival date & time: 01/25/24  1231     History  Chief Complaint  Patient presents with   Back Pain    Nicholas Mcknight is a 63 y.o. male.  Patient is a 63 year old male who presents to the emergency department the chief complaint of right lower back pain with radiation into the superior aspect of his right leg which has been ongoing for approximately the past week.  He notes it has become worse over the past few days.  He notes that last night he did have a sudden sensation of pain in his groin which has since resolved.  He denies any midline back pain.  He has had no recent falls or blunt trauma.  He denies any urinary bowel incontinence, saddle paresthesias, gait changes, fever, chills.  He does have a history of peripheral arterial disease and has had previous stents placed in the left leg.  He denies any pain radiating down to his foot, numbness or paresthesias.   Back Pain      Home Medications Prior to Admission medications   Medication Sig Start Date End Date Taking? Authorizing Provider  amLODipine  (NORVASC ) 5 MG tablet Take 5 mg by mouth daily.    [provider]  atorvastatin  (LIPITOR) 40 MG tablet Take 40 mg by mouth daily.    [provider]  atorvastatin  (LIPITOR) 40 MG tablet Take 1 tablet (40 mg total) by mouth daily. 01/29/23   Adine Hoof, MD  clopidogrel  (PLAVIX ) 75 MG tablet Take 1 tablet (75 mg total) by mouth daily. 10/30/22   Adine Hoof, MD  hydrALAZINE  (APRESOLINE ) 25 MG tablet Take 25 mg by mouth 3 (three) times daily.    [provider]  losartan  (COZAAR ) 100 MG tablet Take 100 mg by mouth every morning.    [provider]  naproxen sodium (ALEVE) 220 MG tablet Take 440 mg by mouth daily as needed (pain).    [provider]  nebivolol (BYSTOLIC) 5 MG tablet Take 5 mg by mouth daily.    [provider]      Allergies    Patient has no known allergies.    Review of Systems   Review of Systems  Musculoskeletal:  Positive for back pain.  All other systems reviewed and are negative.   Physical Exam Updated Vital Signs BP (!) 220/120 (BP Location: Right Arm)   Pulse 91   Temp 98.1 F (36.7 C) (Oral)   Resp 16   Ht 5\' 10"  (1.778 m)   Wt 101.2 kg   SpO2 99%   BMI 32.00 kg/m  Physical Exam Vitals and nursing note reviewed.  Constitutional:      Appearance: Normal appearance.  HENT:     Head: Normocephalic and atraumatic.     Nose: Nose normal.     Mouth/Throat:     Mouth: Mucous membranes are moist.  Eyes:     Extraocular Movements: Extraocular movements intact.     Conjunctiva/sclera: Conjunctivae normal.     Pupils: Pupils are equal, round, and reactive to light.  Cardiovascular:     Rate and Rhythm: Normal rate and regular rhythm.     Pulses: Normal pulses.     Heart sounds: Normal heart sounds. No murmur heard.    No gallop.  Pulmonary:     Effort: Pulmonary effort is normal. No respiratory distress.     Breath sounds: Normal  breath sounds. No stridor. No wheezing, rhonchi or rales.  Abdominal:     General: Abdomen is flat. Bowel sounds are normal. There is no distension.     Palpations: Abdomen is soft. There is no mass.     Tenderness: There is no abdominal tenderness. There is no guarding.  Musculoskeletal:        General: No swelling, tenderness, deformity or signs of injury. Normal range of motion.     Cervical back: Normal range of motion and neck supple. No rigidity or tenderness.     Right lower leg: No edema.     Left lower leg: No edema.     Comments: No midline tenderness over thoracic or lumbar spine, no step-off or deformity, no CVA tenderness  Skin:    General: Skin is warm and dry.     Findings: No bruising or rash.  Neurological:     General: No focal deficit present.     Mental Status: He is alert and oriented to person, place,  and time. Mental status is at baseline.     Cranial Nerves: No cranial nerve deficit.     Sensory: No sensory deficit.     Motor: No weakness.     Coordination: Coordination normal.     Gait: Gait normal.     Deep Tendon Reflexes: Reflexes normal.  Psychiatric:        Mood and Affect: Mood normal.        Behavior: Behavior normal.        Thought Content: Thought content normal.        Judgment: Judgment normal.     ED Results / Procedures / Treatments   Labs (all labs ordered are listed, but only abnormal results are displayed) Labs Reviewed  COMPREHENSIVE METABOLIC PANEL WITH GFR  CBC WITH DIFFERENTIAL/PLATELET  URINALYSIS, ROUTINE W REFLEX MICROSCOPIC    EKG None  Radiology No results found.  Procedures Procedures    Medications Ordered in ED Medications  losartan  (COZAAR ) tablet 100 mg (has no administration in time range)  oxyCODONE -acetaminophen  (PERCOCET/ROXICET) 5-325 MG per tablet 1 tablet (has no administration in time range)  dexamethasone (DECADRON) injection 10 mg (has no administration in time range)    ED Course/ Medical Decision Making/ A&P                                 Medical Decision Making Amount and/or Complexity of Data Reviewed Labs: ordered. Radiology: ordered.  Risk Prescription drug management.   This patient presents to the ED for concern of back pain differential diagnosis includes cauda equina syndrome, vertebral osteomyelitis, epidural abscess, low back strain, meralgia paresthesia, sciatica    Additional history obtained:  Additional history obtained from wife External records from outside source obtained and reviewed including none   Lab Tests:  I Ordered, and personally interpreted labs.  The pertinent results include: Mild leukocytosis, elevated hemoglobin, unremarkable electrolytes, normal kidney function liver function, unremarkable urinalysis   Imaging Studies ordered:  I ordered imaging studies including CTA  of the abdomen and pelvis I independently visualized and interpreted imaging which showed no acute process, no stenosis, dissection, aneurysm I agree with the radiologist interpretation   Medicines ordered and prescription drug management:  I ordered medication including Norco, prednisone for low back pain Reevaluation of the patient after these medicines showed that the patient improved I have reviewed the patients home medicines and have made adjustments as  needed   Problem List / ED Course:  Patient is doing well at this time and is stable for discharge home.  Discussed with patient that imaging has been unremarkable at this point.  He is neurovascularly intact distally and do not suspect underlying etiology such as cauda equina syndrome, vertebral osteomyelitis, epidural abscess.  He has had no recent falls or blunt trauma.  Suspect musculoskeletal etiology of his pain at this point.  Will continue symptomatic treatment and recommend close follow-up with his primary care doctor.  Abdominal exam is benign at this point with no focal tenderness.  He has intact peripheral pulses in the lower extremities and do not suspect an acute arterial occlusion in the lower extremities.  Strict precautions were provided for any new or worsening symptoms.  Patient voiced understanding and had no additional questions.   Social Determinants of Health:  None           Final Clinical Impression(s) / ED Diagnoses Final diagnoses:  None    Rx / DC Orders ED Discharge Orders     None         Roselynn Connors, PA-C 01/25/24 1706    Deatra Face, MD 01/27/24 0745

## 2024-01-25 NOTE — Discharge Instructions (Signed)
 Please take all medications as directed.  Follow-up closely with your primary care doctor on outpatient basis for reevaluation.  Return to emergency department immediately for any new or worsening symptoms.  Please continue taking all of your blood pressure medications as directed.
